# Patient Record
Sex: Female | Born: 1985 | Race: White | Hispanic: No | Marital: Married | Smoking: Never smoker
Health system: Southern US, Community
[De-identification: ages and names within clinical notes are randomized; demographics above are authoritative.]

## PROBLEM LIST (undated history)

## (undated) DIAGNOSIS — Z789 Other specified health status: Secondary | ICD-10-CM

---

## 2005-11-15 HISTORY — PX: WISDOM TOOTH EXTRACTION: SHX21

## 2009-12-15 ENCOUNTER — Ambulatory Visit: Payer: Self-pay | Admitting: Family Medicine

## 2009-12-15 DIAGNOSIS — R42 Dizziness and giddiness: Secondary | ICD-10-CM

## 2009-12-15 DIAGNOSIS — R1902 Left upper quadrant abdominal swelling, mass and lump: Secondary | ICD-10-CM

## 2009-12-17 LAB — CONVERTED CEMR LAB
ALT: 13 units/L (ref 0–53)
Alkaline Phosphatase: 42 units/L (ref 39–117)
BUN: 6 mg/dL (ref 6–23)
Basophils Relative: 0.3 % (ref 0.0–3.0)
Bilirubin, Direct: 0 mg/dL (ref 0.0–0.3)
Chloride: 105 meq/L (ref 96–112)
Creatinine, Ser: 0.9 mg/dL (ref 0.4–1.5)
Eosinophils Relative: 1.3 % (ref 0.0–5.0)
GFR calc non Af Amer: 110.9 mL/min (ref >60)
Glucose, Bld: 78 mg/dL (ref 70–99)
Lymphocytes Relative: 37.1 % (ref 12.0–46.0)
Neutrophils Relative %: 53.4 % (ref 43.0–77.0)
Platelets: 172 10*3/uL (ref 150.0–400.0)
RBC: 4.07 M/uL — ABNORMAL LOW (ref 4.22–5.81)
Total Bilirubin: 0.3 mg/dL (ref 0.3–1.2)
Total Protein: 7.7 g/dL (ref 6.0–8.3)
WBC: 5.8 10*3/uL (ref 4.5–10.5)

## 2009-12-19 ENCOUNTER — Encounter: Admission: RE | Admit: 2009-12-19 | Discharge: 2009-12-19 | Payer: Self-pay | Admitting: Family Medicine

## 2010-01-16 ENCOUNTER — Encounter: Payer: Self-pay | Admitting: Family Medicine

## 2010-10-02 ENCOUNTER — Ambulatory Visit: Payer: Self-pay | Admitting: Family Medicine

## 2010-10-02 DIAGNOSIS — M542 Cervicalgia: Secondary | ICD-10-CM | POA: Insufficient documentation

## 2010-12-06 ENCOUNTER — Encounter: Payer: Self-pay | Admitting: Family Medicine

## 2010-12-15 NOTE — Letter (Signed)
Summary: [facility]   Imported By: Maryln Gottron 02/12/2010 10:16:43  _____________________________________________________________________  External Attachment:    Type:   Image     Comment:   External Document

## 2010-12-15 NOTE — Assessment & Plan Note (Signed)
Summary: NEW PT EST // RS   Vital Signs:  Patient profile:   25 year old female Height:      67.50 inches Weight:      133 pounds BMI:     20.60 Temp:     99 degrees F oral Pulse rate:   72 / minute Pulse rhythm:   regular Resp:     12 per minute BP sitting:   100 / 72  (left arm) Cuff size:   regular  Vitals Entered By: Sid Falcon LPN (December 15, 2009 2:05 PM)  Serial Vital Signs/Assessments:  Time      Position  BP       Pulse  Resp  Temp     By                     27/06                          Evelena Peat MD  CC: New pt to establish   History of Present Illness: New patient to establish care. Has not seen a physician except OB/GYN in approximately 15 years. No immunizations during that time. No chronic medical problems. Takes no medications. No known allergies. Remote history of some bulimia several years ago but none recently.  Family history significant for mother having stroke at 70 grandfather with heart disease and elevated cholesterol.  Patient is married no children. Works as a Environmental manager. Nonsmoker.  Occasional episodes of lightheadedness and even transient syncope which may be related to low blood pressure. No proven hypoglycemia.  For 2 years she's noticed a lump under the left rib cage area left upper quadrant abdomen. Occasional associated pain. No obvious increase in growth  past 2 years. Denies history of injury.  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 Fainting spells  Family History: Family History High cholesterol, grandfather Family History of Stroke F 1st degree relative <60, mother age 29 Family History Hypertension, grandfather Heart disease, grandfather  Social History: Occupation:  Advertising account executive Married Never Smoked Alcohol use-yes Smoking Status:  never Occupation:  employed  Review of Systems  The patient denies anorexia, fever, weight loss, dyspnea on exertion, peripheral edema, prolonged cough, headaches, hemoptysis, abdominal pain, melena, hematochezia, severe indigestion/heartburn, suspicious skin lesions, abnormal bleeding, and enlarged lymph nodes.    Physical Exam  General:  Well-developed,well-nourished,in no acute distress; alert,appropriate and cooperative throughout examination Head:  Normocephalic and atraumatic without obvious abnormalities. No apparent alopecia or balding. Eyes:  No corneal or conjunctival inflammation noted. EOMI. Perrla. Funduscopic exam benign, without hemorrhages, exudates or papilledema. Vision grossly normal. Ears:  External ear exam shows no significant lesions or deformities.  Otoscopic examination reveals clear canals, tympanic membranes are intact bilaterally without bulging, retraction, inflammation or discharge. Hearing is grossly normal bilaterally. Nose:  External nasal examination shows no deformity or inflammation. Nasal mucosa are pink and moist without lesions or exudates. Mouth:  Oral mucosa and oropharynx without lesions or exudates.  Teeth in good repair. Neck:  No deformities, masses, or  tenderness noted. Lungs:  Normal respiratory effort, chest expands symmetrically. Lungs are clear to auscultation, no crackles or wheezes. Heart:  Normal rate and regular rhythm. S1 and S2 normal without gallop, murmur, click, rub or other extra sounds. Abdomen:  soft, non-tender, normal bowel sounds, and no distention.  left upper quadrant she has somewhat  firm somewhat irregular mass which is 2-3 cm in diameter. Depth is somewhat difficult to palpate. Minimally mobile. Nontender no splenomegaly noted. No hepatomegaly. No guarding or rebound Extremities:  No clubbing, cyanosis, edema, or deformity noted with normal full range of motion of all joints.     Impression & Recommendations:  Problem # 1:  ABDOMINAL MASS, LEFT UPPER QUADRANT (ICD-789.32) ?etiology.  This does not feel like a lipoma and needs further evaluation.  Start with labs and abd ultrasound.  Explained may need CT to further evaluate. Orders: Radiology Referral (Radiology) Venipuncture (84696) TLB-CBC Platelet - w/Differential (85025-CBCD) TLB-Hepatic/Liver Function Pnl (80076-HEPATIC) TLB-BMP (Basic Metabolic Panel-BMET) (80048-METABOL)  Problem # 2:  Preventive Health Care (ICD-V70.0) needs Tdap and this is given.  Problem # 3:  POSTURAL LIGHTHEADEDNESS (ICD-780.4) prob related to generally low BP with intermittent dehydration.  Drink more fluids and consider occ supplementation with Gatorade.  Other Orders: Tdap => 36yrs IM (29528) Admin 1st Vaccine (41324)  Patient Instructions: 1)  Will call you regarding lab results. 2)  Drink plenty of fluids and consider supplementing with fluids such as Gatorade if you're having frequent dizziness. 3)  We will call you regarding ultrasound after this is scheduled.  Preventive Care Screening     Pap 10/29/09, normal Last menstural cycle 11/11/09, regular    Immunizations Administered:  Tetanus Vaccine:    Vaccine Type: Tdap    Site: left deltoid    Mfr: GlaxoSmithKline    Dose: 0.5 ml    Route: IM    Given by: Sid Falcon LPN    Exp. Date: 09/10/2010    Lot #: [id number]
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
## 2011-04-22 LAB — RUBELLA ANTIBODY, IGM: Rubella: IMMUNE

## 2011-04-22 LAB — ABO/RH: RH Type: POSITIVE

## 2011-04-22 LAB — HEPATITIS B SURFACE ANTIGEN: Hepatitis B Surface Ag: NEGATIVE

## 2011-04-22 LAB — ANTIBODY SCREEN: Antibody Screen: NEGATIVE

## 2011-10-28 LAB — STREP B DNA PROBE: GBS: POSITIVE

## 2011-10-29 ENCOUNTER — Other Ambulatory Visit (HOSPITAL_COMMUNITY): Payer: Self-pay | Admitting: Obstetrics and Gynecology

## 2011-11-05 ENCOUNTER — Ambulatory Visit (HOSPITAL_COMMUNITY)
Admission: RE | Admit: 2011-11-05 | Discharge: 2011-11-05 | Disposition: A | Discharge status: Home / Self Care | Payer: BC Managed Care – PPO | Source: Ambulatory Visit | Attending: Obstetrics and Gynecology | Admitting: Obstetrics and Gynecology

## 2011-11-05 DIAGNOSIS — O365990 Maternal care for other known or suspected poor fetal growth, unspecified trimester, not applicable or unspecified: Secondary | ICD-10-CM | POA: Insufficient documentation

## 2011-11-05 DIAGNOSIS — Z3689 Encounter for other specified antenatal screening: Secondary | ICD-10-CM | POA: Insufficient documentation

## 2011-11-10 ENCOUNTER — Other Ambulatory Visit (HOSPITAL_COMMUNITY): Payer: Self-pay | Admitting: Obstetrics and Gynecology

## 2011-11-10 DIAGNOSIS — O4100X0 Oligohydramnios, unspecified trimester, not applicable or unspecified: Secondary | ICD-10-CM

## 2011-11-12 ENCOUNTER — Ambulatory Visit (HOSPITAL_COMMUNITY)
Admission: RE | Admit: 2011-11-12 | Discharge: 2011-11-12 | Disposition: A | Discharge status: Home / Self Care | Payer: BC Managed Care – PPO | Source: Ambulatory Visit | Attending: Obstetrics and Gynecology | Admitting: Obstetrics and Gynecology

## 2011-11-12 DIAGNOSIS — Z3689 Encounter for other specified antenatal screening: Secondary | ICD-10-CM | POA: Insufficient documentation

## 2011-11-12 DIAGNOSIS — O4100X0 Oligohydramnios, unspecified trimester, not applicable or unspecified: Secondary | ICD-10-CM

## 2011-11-16 NOTE — L&D Delivery Note (Signed)
Delivery Note Pt pushed great about 30 minutes and at 2:40 AM a healthy female was delivered via Vaginal, Spontaneous Delivery (Presentation: Right Occiput Anterior).  APGAR: 8, 9; weight 8 lb 2.2 oz (3690 g).   Placenta status: Intact, Spontaneous.  Cord: 3 vessels with the following complications: Loose nuchal x 1.  Anesthesia: Local  Episiotomy: None Lacerations: 2nd degree;Perineal Suture Repair: 3.0 vicryl rapide Est. Blood Loss (mL): 400  Mom to postpartum.  Baby to nursery-stable.  Oliver Pila 11/25/2011, 3:15 AM

## 2011-11-22 ENCOUNTER — Encounter (HOSPITAL_COMMUNITY): Payer: Self-pay | Admitting: *Deleted

## 2011-11-22 ENCOUNTER — Telehealth (HOSPITAL_COMMUNITY): Payer: Self-pay | Admitting: *Deleted

## 2011-11-22 NOTE — Telephone Encounter (Signed)
Preadmission screen  

## 2011-11-25 ENCOUNTER — Encounter (HOSPITAL_COMMUNITY): Payer: Self-pay

## 2011-11-25 ENCOUNTER — Inpatient Hospital Stay (HOSPITAL_COMMUNITY)
Admission: AD | Admit: 2011-11-25 | Discharge: 2011-11-27 | DRG: 373 | Disposition: A | Discharge status: Home / Self Care | Payer: BC Managed Care – PPO | Source: Ambulatory Visit | Attending: Obstetrics and Gynecology | Admitting: Obstetrics and Gynecology

## 2011-11-25 DIAGNOSIS — O99892 Other specified diseases and conditions complicating childbirth: Secondary | ICD-10-CM | POA: Diagnosis present

## 2011-11-25 DIAGNOSIS — Z22330 Carrier of Group B streptococcus: Secondary | ICD-10-CM

## 2011-11-25 LAB — CBC
HCT: 32.5 % — ABNORMAL LOW (ref 36.0–46.0)
MCH: 31.8 pg (ref 26.0–34.0)
MCHC: 35.4 g/dL (ref 30.0–36.0)
MCV: 89.8 fL (ref 78.0–100.0)
RDW: 12.5 % (ref 11.5–15.5)

## 2011-11-25 MED ORDER — TETANUS-DIPHTH-ACELL PERTUSSIS 5-2.5-18.5 LF-MCG/0.5 IM SUSP: 0.5000 mL | Freq: Once | INTRAMUSCULAR | Status: DC

## 2011-11-25 MED ORDER — EPHEDRINE 5 MG/ML INJ: 10.0000 mg | INTRAVENOUS | Status: DC | PRN

## 2011-11-25 MED ORDER — SENNOSIDES-DOCUSATE SODIUM 8.6-50 MG PO TABS
2.0000 | ORAL_TABLET | Freq: Every day | ORAL | Status: DC
  Administered 2011-11-25 – 2011-11-26 (×2): 2 via ORAL

## 2011-11-25 MED ORDER — SIMETHICONE 80 MG PO CHEW: 80.0000 mg | CHEWABLE_TABLET | ORAL | Status: DC | PRN

## 2011-11-25 MED ORDER — IBUPROFEN 600 MG PO TABS
600.0000 mg | ORAL_TABLET | Freq: Four times a day (QID) | ORAL | Status: DC
  Administered 2011-11-25 – 2011-11-27 (×9): 600 mg via ORAL
  Filled 2011-11-25 (×9): qty 1

## 2011-11-25 MED ORDER — FENTANYL 2.5 MCG/ML BUPIVACAINE 1/10 % EPIDURAL INFUSION (WH - ANES): 14.0000 mL/h | INTRAMUSCULAR | Status: DC

## 2011-11-25 MED ORDER — ONDANSETRON HCL 4 MG/2ML IJ SOLN: 4.0000 mg | INTRAMUSCULAR | Status: DC | PRN

## 2011-11-25 MED ORDER — LANOLIN HYDROUS EX OINT: TOPICAL_OINTMENT | CUTANEOUS | Status: DC | PRN

## 2011-11-25 MED ORDER — BUTORPHANOL TARTRATE 2 MG/ML IJ SOLN: 1.0000 mg | INTRAMUSCULAR | Status: DC | PRN

## 2011-11-25 MED ORDER — DIBUCAINE 1 % RE OINT: 1.0000 "application " | TOPICAL_OINTMENT | RECTAL | Status: DC | PRN

## 2011-11-25 MED ORDER — OXYTOCIN 20 UNITS IN LACTATED RINGERS INFUSION - SIMPLE: 125.0000 mL/h | Freq: Once | INTRAVENOUS | Status: DC

## 2011-11-25 MED ORDER — OXYCODONE-ACETAMINOPHEN 5-325 MG PO TABS: 1.0000 | ORAL_TABLET | ORAL | Status: DC | PRN

## 2011-11-25 MED ORDER — ACETAMINOPHEN 325 MG PO TABS: 650.0000 mg | ORAL_TABLET | ORAL | Status: DC | PRN

## 2011-11-25 MED ORDER — ONDANSETRON HCL 4 MG/2ML IJ SOLN: 4.0000 mg | Freq: Four times a day (QID) | INTRAMUSCULAR | Status: DC | PRN

## 2011-11-25 MED ORDER — BENZOCAINE-MENTHOL 20-0.5 % EX AERO
INHALATION_SPRAY | CUTANEOUS | Status: AC
  Filled 2011-11-25: qty 56

## 2011-11-25 MED ORDER — OXYTOCIN BOLUS FROM INFUSION
500.0000 mL | Freq: Once | INTRAVENOUS | Status: AC
  Administered 2011-11-25: 500 mL via INTRAVENOUS
  Filled 2011-11-25: qty 500
  Filled 2011-11-25: qty 1000

## 2011-11-25 MED ORDER — BENZOCAINE-MENTHOL 20-0.5 % EX AERO: 1.0000 "application " | INHALATION_SPRAY | CUTANEOUS | Status: DC | PRN

## 2011-11-25 MED ORDER — DIPHENHYDRAMINE HCL 25 MG PO CAPS: 25.0000 mg | ORAL_CAPSULE | Freq: Four times a day (QID) | ORAL | Status: DC | PRN

## 2011-11-25 MED ORDER — PHENYLEPHRINE 40 MCG/ML (10ML) SYRINGE FOR IV PUSH (FOR BLOOD PRESSURE SUPPORT): 80.0000 ug | PREFILLED_SYRINGE | INTRAVENOUS | Status: DC | PRN

## 2011-11-25 MED ORDER — LIDOCAINE HCL (PF) 1 % IJ SOLN
30.0000 mL | INTRAMUSCULAR | Status: DC | PRN
  Administered 2011-11-25: 30 mL via SUBCUTANEOUS
  Filled 2011-11-25: qty 30

## 2011-11-25 MED ORDER — LACTATED RINGERS IV SOLN: 500.0000 mL | INTRAVENOUS | Status: DC | PRN

## 2011-11-25 MED ORDER — LACTATED RINGERS IV SOLN
INTRAVENOUS | Status: DC
  Administered 2011-11-25: 02:00:00 via INTRAVENOUS

## 2011-11-25 MED ORDER — ZOLPIDEM TARTRATE 5 MG PO TABS: 5.0000 mg | ORAL_TABLET | Freq: Every evening | ORAL | Status: DC | PRN

## 2011-11-25 MED ORDER — AMPICILLIN SODIUM 2 G IJ SOLR
2.0000 g | Freq: Once | INTRAMUSCULAR | Status: AC
  Administered 2011-11-25: 2 g via INTRAVENOUS
  Filled 2011-11-25: qty 2000

## 2011-11-25 MED ORDER — IBUPROFEN 600 MG PO TABS
600.0000 mg | ORAL_TABLET | Freq: Four times a day (QID) | ORAL | Status: DC | PRN
  Administered 2011-11-25: 600 mg via ORAL
  Filled 2011-11-25: qty 1

## 2011-11-25 MED ORDER — CITRIC ACID-SODIUM CITRATE 334-500 MG/5ML PO SOLN: 30.0000 mL | ORAL | Status: DC | PRN

## 2011-11-25 MED ORDER — DIPHENHYDRAMINE HCL 50 MG/ML IJ SOLN: 12.5000 mg | INTRAMUSCULAR | Status: DC | PRN

## 2011-11-25 MED ORDER — WITCH HAZEL-GLYCERIN EX PADS: 1.0000 "application " | MEDICATED_PAD | CUTANEOUS | Status: DC | PRN

## 2011-11-25 MED ORDER — ONDANSETRON HCL 4 MG PO TABS: 4.0000 mg | ORAL_TABLET | ORAL | Status: DC | PRN

## 2011-11-25 MED ORDER — PRENATAL MULTIVITAMIN CH
1.0000 | ORAL_TABLET | Freq: Every day | ORAL | Status: DC
  Administered 2011-11-25 – 2011-11-27 (×3): 1 via ORAL
  Filled 2011-11-25 (×3): qty 1

## 2011-11-25 MED ORDER — FLEET ENEMA 7-19 GM/118ML RE ENEM: 1.0000 | ENEMA | RECTAL | Status: DC | PRN

## 2011-11-25 MED ORDER — LACTATED RINGERS IV SOLN: 500.0000 mL | Freq: Once | INTRAVENOUS | Status: DC

## 2011-11-25 MED ORDER — OXYCODONE-ACETAMINOPHEN 5-325 MG PO TABS: 2.0000 | ORAL_TABLET | ORAL | Status: DC | PRN

## 2011-11-25 NOTE — H&P (Signed)
Felicia Simmons is a 26 y.o. female G1P0  At 107 1/7 weeks (EDD 11/24/11 by 8 week [place]) presenting for active labor.  Prenatal care uneventful except positive GBS status.  Maternal Medical History:  Reason for admission: Reason for admission: contractions.  Contractions: Onset was 6-12 hours ago.   Frequency: regular.   Perceived severity is strong.    Fetal activity: Perceived fetal activity is normal.      OB History    Grav Para Term Preterm Abortions TAB SAB Ect Mult Living   1 1 1       1      History reviewed. No pertinent past medical history. History reviewed. No pertinent past surgical history. Family History: family history includes Heart disease in her maternal grandfather and paternal grandfather; Hypertension in her maternal grandfather and paternal grandfather; and Stroke in her mother. Social History:  reports that she has never smoked. She has never used smokeless tobacco. She reports that she does not drink alcohol or use illicit drugs.  ROS  Dilation: 10 Effacement (%): 100 Station: 0 Exam by:: Senaida Ores, MD Blood pressure 96/78, pulse 91, temperature 98.3 F (36.8 C), temperature source Oral, resp. rate 18, height 5\' 10"  (1.778 m), weight 68.04 kg (150 lb), last menstrual period 02/12/2011, unknown if currently breastfeeding. Maternal Exam:  Uterine Assessment: Contraction strength is firm.  Contraction frequency is regular.   Abdomen: Patient reports no abdominal tenderness. Fetal presentation: vertex  Introitus: Normal vulva. Normal vagina.    Fetal Exam Fetal State Assessment: Category I - tracings are normal.     Physical Exam  Constitutional: She is oriented to person, place, and time. She appears well-developed and well-nourished.  Cardiovascular: Normal rate and regular rhythm.   Respiratory: Effort normal and breath sounds normal.  GI: Soft. Bowel sounds are normal.  Genitourinary: Vagina normal and uterus normal.       Cervix on admission c/6/0  rapidly progressed to complete.  Musculoskeletal: Normal range of motion.  Neurological: She is alert and oriented to person, place, and time.  Psychiatric: She has a normal mood and affect. Her behavior is normal.    Prenatal labs: ABO, Rh: O/Positive/-- (06/07 0000) Antibody: Negative (06/07 0000) Rubella: Immune (06/07 0000) RPR: Nonreactive (06/07 0000)  HBsAg: Negative (06/07 0000)  HIV: Non-reactive (06/07 0000)  GBS: Positive (12/13 0000)  One hour glucola WNL Declined genetic screenings  Assessment/Plan: Pt was able to get one dose of ampicillin, but progressed rapidly to complete dilation and began pushing with no pain meds.    Oliver Pila 11/25/2011, 3:09 AM

## 2011-11-25 NOTE — ED Notes (Signed)
Pt going to room 167 for triage.

## 2011-11-25 NOTE — Progress Notes (Signed)
Post Partum Day 0 Subjective: no complaints and voiding  Objective: Blood pressure 98/60, pulse 76, temperature 98.8 F (37.1 C), temperature source Oral, resp. rate 18, height 5\' 10"  (1.778 m), weight 68.04 kg (150 lb), last menstrual period 02/12/2011, unknown if currently breastfeeding.  Physical Exam:  General: alert Lochia: appropriate Uterine Fundus: firm     Basename 11/25/11 0133  HGB 11.5*  HCT 32.5*    Assessment/Plan: Continue care.  D/w pt circumcision in detail, desires to proceed.   LOS: 0 days   Fredonia Casalino W 11/25/2011, 12:16 PM

## 2011-11-26 NOTE — Progress Notes (Signed)
Post Partum Day 1 Subjective: no complaints and tolerating PO  Objective: Blood pressure 93/58, pulse 62, temperature 98.2 F (36.8 C), temperature source Oral, resp. rate 18, height 5\' 10"  (1.778 m), weight 68.04 kg (150 lb), last menstrual period 02/12/2011, unknown if currently breastfeeding.  Physical Exam:  General: alert Lochia: appropriate Uterine Fundus: firm  Basename 11/25/11 0133  HGB 11.5*  HCT 32.5*    Assessment/Plan: Plan for discharge tomorrow   LOS: 1 day   Kenniya Westrich W 11/26/2011, 7:55 AM

## 2011-11-27 MED ORDER — PRENATAL MULTIVITAMIN CH: 1.0000 | ORAL_TABLET | Freq: Every day | ORAL | Status: DC

## 2011-11-27 MED ORDER — IBUPROFEN 800 MG PO TABS: 800.0000 mg | ORAL_TABLET | Freq: Four times a day (QID) | ORAL | Status: AC

## 2011-11-27 MED ORDER — OXYCODONE-ACETAMINOPHEN 5-325 MG PO TABS: 1.0000 | ORAL_TABLET | Freq: Four times a day (QID) | ORAL | Status: AC | PRN

## 2011-11-27 NOTE — Discharge Summary (Signed)
Obstetric Discharge Summary Reason for Admission: onset of labor Prenatal Procedures: none Intrapartum Procedures: spontaneous vaginal delivery Postpartum Procedures: none Complications-Operative and Postpartum: 2nd degree perineal laceration Hemoglobin  Date Value Range Status  11/25/2011 11.5* 12.0-15.0 (g/dL) Final     HCT  Date Value Range Status  11/25/2011 32.5* 36.0-46.0 (%) Final    Discharge Diagnoses: Term Pregnancy-delivered  Discharge Information: Date: 11/27/2011 Activity: pelvic rest Diet: routine Medications: PNV, Ibuprofen and Percocet Condition: stable Instructions: refer to practice specific booklet Discharge to: home Follow-up Information    Follow up with BOVARD,Glenora Morocho, MD. Schedule an appointment as soon as possible for a visit in 6 weeks.   Contact information:   [address] [facility] [address] [place] [address] [phone]          Newborn Data: Live born female  Birth Weight: 8 lb 2.2 oz (3690 g) APGAR: 8, 9  Home with mother.  BOVARD,Nyazia Canevari 11/27/2011, 10:56 AM

## 2011-11-27 NOTE — Progress Notes (Signed)
Post Partum Day 2 Subjective: no complaints, up ad lib, voiding, tolerating PO and nl lochia, pain controlled  Objective: Blood pressure 114/75, pulse 66, temperature 98 F (36.7 C), temperature source Oral, resp. rate 18, height 5\' 10"  (1.778 m), weight 68.04 kg (150 lb), last menstrual period 02/12/2011, unknown if currently breastfeeding.  Physical Exam:  General: alert and no distress Lochia: appropriate Uterine Fundus: firm    Basename 11/25/11 0133  HGB 11.5*  HCT 32.5*    Assessment/Plan: Discharge home and Breastfeeding D/c with Motrin/percocet/ pnv, f/u 6 weeks  LOS: 2 days   BOVARD,Dalayna Lauter 11/27/2011, 10:49 AM

## 2011-11-30 ENCOUNTER — Inpatient Hospital Stay (HOSPITAL_COMMUNITY): Admission: RE | Admit: 2011-11-30 | Payer: BC Managed Care – PPO | Source: Ambulatory Visit

## 2011-11-30 ENCOUNTER — Encounter (HOSPITAL_COMMUNITY): Payer: BC Managed Care – PPO

## 2012-11-15 NOTE — L&D Delivery Note (Signed)
Delivery Note At 4:32 AM a healthy female was delivered via Vaginal, Spontaneous Delivery (Presentation: ; Occiput Anterior).  APGAR: 8, 8; weight pending.   Placenta status: Intact, Spontaneous.  Cord: 3 vessels with the following complications: None.    Anesthesia: Local  Episiotomy: None Lacerations: 2nd degree;Vaginal;Periurethral Suture Repair: 3.0 vicryl rapide Est. Blood Loss (mL): 300cc  Mom to postpartum.  Baby to stay with mother.  Oliver Pila 08/18/2013, 5:03 AM

## 2013-01-30 LAB — OB RESULTS CONSOLE ABO/RH: RH Type: POSITIVE

## 2013-01-30 LAB — OB RESULTS CONSOLE HIV ANTIBODY (ROUTINE TESTING): HIV: NONREACTIVE

## 2013-01-30 LAB — OB RESULTS CONSOLE GC/CHLAMYDIA: Gonorrhea: NEGATIVE

## 2013-01-30 LAB — OB RESULTS CONSOLE RUBELLA ANTIBODY, IGM: Rubella: IMMUNE

## 2013-01-30 LAB — OB RESULTS CONSOLE RPR: RPR: NONREACTIVE

## 2013-08-18 ENCOUNTER — Encounter (HOSPITAL_COMMUNITY): Payer: Self-pay | Admitting: *Deleted

## 2013-08-18 ENCOUNTER — Inpatient Hospital Stay (HOSPITAL_COMMUNITY)
Admission: AD | Admit: 2013-08-18 | Discharge: 2013-08-19 | DRG: 373 | Disposition: A | Discharge status: Home / Self Care | Payer: BC Managed Care – PPO | Source: Ambulatory Visit | Attending: Obstetrics and Gynecology | Admitting: Obstetrics and Gynecology

## 2013-08-18 DIAGNOSIS — Z22330 Carrier of Group B streptococcus: Secondary | ICD-10-CM

## 2013-08-18 DIAGNOSIS — O99892 Other specified diseases and conditions complicating childbirth: Secondary | ICD-10-CM | POA: Diagnosis present

## 2013-08-18 LAB — CBC
HCT: 32.7 % — ABNORMAL LOW (ref 36.0–46.0)
Hemoglobin: 11.5 g/dL — ABNORMAL LOW (ref 12.0–15.0)
MCH: 31.2 pg (ref 26.0–34.0)
MCHC: 35.2 g/dL (ref 30.0–36.0)
MCV: 88.6 fL (ref 78.0–100.0)
Platelets: 96 10*3/uL — ABNORMAL LOW (ref 150–400)
RBC: 3.69 MIL/uL — ABNORMAL LOW (ref 3.87–5.11)
WBC: 8.3 10*3/uL (ref 4.0–10.5)

## 2013-08-18 MED ORDER — WITCH HAZEL-GLYCERIN EX PADS: 1.0000 "application " | MEDICATED_PAD | CUTANEOUS | Status: DC | PRN

## 2013-08-18 MED ORDER — ZOLPIDEM TARTRATE 5 MG PO TABS: 5.0000 mg | ORAL_TABLET | Freq: Every evening | ORAL | Status: DC | PRN

## 2013-08-18 MED ORDER — DIBUCAINE 1 % RE OINT: 1.0000 "application " | TOPICAL_OINTMENT | RECTAL | Status: DC | PRN

## 2013-08-18 MED ORDER — ONDANSETRON HCL 4 MG PO TABS: 4.0000 mg | ORAL_TABLET | ORAL | Status: DC | PRN

## 2013-08-18 MED ORDER — ONDANSETRON HCL 4 MG/2ML IJ SOLN: 4.0000 mg | INTRAMUSCULAR | Status: DC | PRN

## 2013-08-18 MED ORDER — SODIUM CHLORIDE 0.9 % IV SOLN
2.0000 g | Freq: Once | INTRAVENOUS | Status: AC
  Administered 2013-08-18: 2 g via INTRAVENOUS
  Filled 2013-08-18: qty 2000

## 2013-08-18 MED ORDER — FLEET ENEMA 7-19 GM/118ML RE ENEM: 1.0000 | ENEMA | RECTAL | Status: DC | PRN

## 2013-08-18 MED ORDER — LIDOCAINE HCL (PF) 1 % IJ SOLN
30.0000 mL | INTRAMUSCULAR | Status: DC | PRN
  Administered 2013-08-18: 30 mL via SUBCUTANEOUS
  Filled 2013-08-18 (×2): qty 30

## 2013-08-18 MED ORDER — PRENATAL MULTIVITAMIN CH
1.0000 | ORAL_TABLET | Freq: Every day | ORAL | Status: DC
  Administered 2013-08-18 – 2013-08-19 (×2): 1 via ORAL
  Filled 2013-08-18 (×2): qty 1

## 2013-08-18 MED ORDER — OXYCODONE-ACETAMINOPHEN 5-325 MG PO TABS
1.0000 | ORAL_TABLET | ORAL | Status: DC | PRN
  Administered 2013-08-18: 1 via ORAL
  Filled 2013-08-18: qty 1

## 2013-08-18 MED ORDER — OXYCODONE-ACETAMINOPHEN 5-325 MG PO TABS: 1.0000 | ORAL_TABLET | ORAL | Status: DC | PRN

## 2013-08-18 MED ORDER — LANOLIN HYDROUS EX OINT: TOPICAL_OINTMENT | CUTANEOUS | Status: DC | PRN

## 2013-08-18 MED ORDER — LACTATED RINGERS IV SOLN
INTRAVENOUS | Status: DC
  Administered 2013-08-18: 04:00:00 via INTRAVENOUS

## 2013-08-18 MED ORDER — OXYTOCIN BOLUS FROM INFUSION: 500.0000 mL | INTRAVENOUS | Status: DC

## 2013-08-18 MED ORDER — OXYTOCIN 40 UNITS IN LACTATED RINGERS INFUSION - SIMPLE MED
62.5000 mL/h | INTRAVENOUS | Status: DC
  Administered 2013-08-18: 500 mL/h via INTRAVENOUS
  Administered 2013-08-18: 62.5 mL/h via INTRAVENOUS
  Filled 2013-08-18: qty 1000

## 2013-08-18 MED ORDER — TETANUS-DIPHTH-ACELL PERTUSSIS 5-2.5-18.5 LF-MCG/0.5 IM SUSP: 0.5000 mL | Freq: Once | INTRAMUSCULAR | Status: DC

## 2013-08-18 MED ORDER — ONDANSETRON HCL 4 MG/2ML IJ SOLN: 4.0000 mg | Freq: Four times a day (QID) | INTRAMUSCULAR | Status: DC | PRN

## 2013-08-18 MED ORDER — SENNOSIDES-DOCUSATE SODIUM 8.6-50 MG PO TABS
2.0000 | ORAL_TABLET | ORAL | Status: DC
  Administered 2013-08-19: 2 via ORAL

## 2013-08-18 MED ORDER — IBUPROFEN 600 MG PO TABS
600.0000 mg | ORAL_TABLET | Freq: Four times a day (QID) | ORAL | Status: DC
  Administered 2013-08-18 – 2013-08-19 (×5): 600 mg via ORAL
  Filled 2013-08-18 (×5): qty 1

## 2013-08-18 MED ORDER — BENZOCAINE-MENTHOL 20-0.5 % EX AERO
1.0000 "application " | INHALATION_SPRAY | CUTANEOUS | Status: DC | PRN
  Administered 2013-08-18: 1 via TOPICAL
  Filled 2013-08-18: qty 56

## 2013-08-18 MED ORDER — LACTATED RINGERS IV SOLN: 500.0000 mL | INTRAVENOUS | Status: DC | PRN

## 2013-08-18 MED ORDER — DIPHENHYDRAMINE HCL 25 MG PO CAPS: 25.0000 mg | ORAL_CAPSULE | Freq: Four times a day (QID) | ORAL | Status: DC | PRN

## 2013-08-18 MED ORDER — SIMETHICONE 80 MG PO CHEW: 80.0000 mg | CHEWABLE_TABLET | ORAL | Status: DC | PRN

## 2013-08-18 MED ORDER — CITRIC ACID-SODIUM CITRATE 334-500 MG/5ML PO SOLN: 30.0000 mL | ORAL | Status: DC | PRN

## 2013-08-18 MED ORDER — ACETAMINOPHEN 325 MG PO TABS: 650.0000 mg | ORAL_TABLET | ORAL | Status: DC | PRN

## 2013-08-18 MED ORDER — IBUPROFEN 600 MG PO TABS
600.0000 mg | ORAL_TABLET | Freq: Four times a day (QID) | ORAL | Status: DC | PRN
  Administered 2013-08-18: 600 mg via ORAL
  Filled 2013-08-18: qty 1

## 2013-08-18 NOTE — H&P (Signed)
Felicia Simmons is a 27 y.o. female G2P1001 at 39+weeks (EDD 08/24/13 by a 6 week [place]) presenting for active labor and cervical dilation to 8 cm.  Prenatal care complicated by +GBS.  Otherwise uneventful.   Maternal Medical History:  Reason for admission: Contractions.   Contractions: Onset was 1-2 hours ago.   Frequency: regular.   Perceived severity is strong.    Fetal activity: Perceived fetal activity is normal.    Prenatal Complications - Diabetes: none.   Past ObHx NSVD 1/13 8#2oz  No past medical history on file. No past surgical history on file. Family History: family history includes Heart disease in her maternal grandfather and paternal grandfather; Hypertension in her maternal grandfather and paternal grandfather; Stroke in her mother. Social History:  reports that she has never smoked. She has never used smokeless tobacco. She reports that she does not drink alcohol or use illicit drugs.   Prenatal Transfer Tool  Maternal Diabetes: No Genetic Screening: Declined Maternal Ultrasounds/Referrals: Normal Fetal Ultrasounds or other Referrals:  None Maternal Substance Abuse:  No Significant Maternal Medications:  None Significant Maternal Lab Results:  Lab values include: Group B Strep positive Other Comments:  None  ROS    unknown if currently breastfeeding. Maternal Exam:  Uterine Assessment: Contraction strength is firm.  Contraction frequency is regular.   Abdomen: Patient reports no abdominal tenderness. Fetal presentation: vertex  Introitus: Normal vulva. Normal vagina.    Physical Exam  Constitutional: She is oriented to person, place, and time. She appears well-developed and well-nourished.  Cardiovascular: Normal rate and regular rhythm.   Respiratory: Effort normal and breath sounds normal.  GI: Soft.  Genitourinary: Vagina normal and uterus normal.  Neurological: She is alert and oriented to person, place, and time.  Psychiatric: She has a normal mood  and affect.    Prenatal labs: ABO, Rh:  O positive Antibody:  Negative Rubella:  Immune RPR:   NR HBsAg:  Neg  HIV:   NR GBS:   Positive One hour GTT 105 Declined genetic screenings  Assessment/Plan: Pt arrives in active labor, will try to get Ampicillin on board.  Expect vaginal delivery.   Oliver Pila 08/18/2013, 3:29 AM

## 2013-08-18 NOTE — MAU Note (Signed)
Patient presents with complaints of strong contractions. Was 4cm on Wednesday and has a history of fast labor. SVE 8/80/-1 intact. Dr. Senaida Ores notified of SVE and GBS positive. Orders received and patient transferred to Rm 168.

## 2013-08-19 LAB — CBC
MCHC: 35.4 g/dL (ref 30.0–36.0)
MCV: 91.7 fL (ref 78.0–100.0)
Platelets: 102 10*3/uL — ABNORMAL LOW (ref 150–400)
RDW: 12.4 % (ref 11.5–15.5)
WBC: 9.7 10*3/uL (ref 4.0–10.5)

## 2013-08-19 MED ORDER — IBUPROFEN 600 MG PO TABS: 600.0000 mg | ORAL_TABLET | Freq: Four times a day (QID) | ORAL | Status: DC

## 2013-08-19 MED ORDER — OXYCODONE-ACETAMINOPHEN 5-325 MG PO TABS: 1.0000 | ORAL_TABLET | ORAL | Status: DC | PRN

## 2013-08-19 NOTE — Discharge Summary (Signed)
Obstetric Discharge Summary Reason for Admission: onset of labor Prenatal Procedures: none Intrapartum Procedures: spontaneous vaginal delivery Postpartum Procedures: none Complications-Operative and Postpartum: second degree perineal laceration Hemoglobin  Date Value Range Status  08/19/2013 10.5* 12.0 - 15.0 g/dL Final     HCT  Date Value Range Status  08/19/2013 29.7* 36.0 - 46.0 % Final    Physical Exam:  General: alert and cooperative Lochia: appropriate Uterine Fundus: firm   Discharge Diagnoses: Term Pregnancy-delivered  Discharge Information: Date: 08/19/2013 Activity: pelvic rest Diet: routine Medications: Ibuprofen and Percocet Condition: improved Instructions: refer to practice specific booklet Discharge to: home Follow-up Information   Follow up with Felicia Pila, MD. Schedule an appointment as soon as possible for a visit in 6 weeks. (postpartum)    Specialty:  Obstetrics and Gynecology   Contact information:   [address], [address] [place] [address] [phone]       Newborn Data: Live born female  Birth Weight: 9 lb 9.1 oz (4340 g) APGAR: 8, 8  Home with mother.  Felicia Simmons 08/19/2013, 10:16 AM

## 2013-08-19 NOTE — Progress Notes (Signed)
Subjective: Postpartum Day 1 Patient reports tolerating PO and no problems voiding.    Objective: Vital signs in last 24 hours: Temp:  [98 F (36.7 C)-98.9 F (37.2 C)] 98.1 F (36.7 C) (10/05 0519) Pulse Rate:  [53-69] 53 (10/05 0519) Resp:  [16-20] 18 (10/05 0519) BP: (99-110)/(59-71) 99/59 mmHg (10/05 0519)  Physical Exam:  General: alert and cooperative Lochia: appropriate Uterine Fundus: firm    Recent Labs  08/18/13 0355 08/19/13 0625  HGB 11.5* 10.5*  HCT 32.7* 29.7*    Assessment/Plan:  Discharge home this pm if baby able to go.  Michala Deblanc W 08/19/2013, 10:14 AM

## 2014-09-16 ENCOUNTER — Encounter (HOSPITAL_COMMUNITY): Payer: Self-pay | Admitting: *Deleted

## 2014-10-30 LAB — OB RESULTS CONSOLE ABO/RH: RH Type: POSITIVE

## 2014-10-30 LAB — OB RESULTS CONSOLE RUBELLA ANTIBODY, IGM: Rubella: IMMUNE

## 2014-10-30 LAB — OB RESULTS CONSOLE ANTIBODY SCREEN: ANTIBODY SCREEN: NEGATIVE

## 2014-10-30 LAB — OB RESULTS CONSOLE GC/CHLAMYDIA
Chlamydia: NEGATIVE
GC PROBE AMP, GENITAL: NEGATIVE

## 2014-10-30 LAB — OB RESULTS CONSOLE RPR: RPR: NONREACTIVE

## 2014-10-30 LAB — OB RESULTS CONSOLE HIV ANTIBODY (ROUTINE TESTING): HIV: NONREACTIVE

## 2014-10-30 LAB — OB RESULTS CONSOLE HEPATITIS B SURFACE ANTIGEN: HEP B S AG: NEGATIVE

## 2014-11-15 NOTE — L&D Delivery Note (Signed)
Delivery Note At 4:53 AM a viable female was delivered via Vaginal, Spontaneous Delivery (Presentation: Right Occiput Anterior).  APGAR: 8, 9; weight pending.   Placenta status: Intact, Manual removal.  Cord: 3 vessels with the following complications: None.  Anesthesia: None  Episiotomy: None Lacerations: None Suture Repair: none Est. Blood Loss (mL): 200  Mom to postpartum.  Baby to Couplet care / Skin to Skin.  Will do circumcision tomorrow morning.  [name] D 05/21/2015, 5:08 AM

## 2015-05-07 LAB — OB RESULTS CONSOLE GBS: GBS: POSITIVE

## 2015-05-21 ENCOUNTER — Inpatient Hospital Stay (HOSPITAL_COMMUNITY)
Admission: AD | Admit: 2015-05-21 | Discharge: 2015-05-22 | DRG: 767 | Disposition: A | Discharge status: Home / Self Care | Payer: BLUE CROSS/BLUE SHIELD | Source: Ambulatory Visit | Attending: Obstetrics and Gynecology | Admitting: Obstetrics and Gynecology

## 2015-05-21 ENCOUNTER — Encounter (HOSPITAL_COMMUNITY): Payer: Self-pay | Admitting: *Deleted

## 2015-05-21 DIAGNOSIS — O99824 Streptococcus B carrier state complicating childbirth: Secondary | ICD-10-CM | POA: Diagnosis present

## 2015-05-21 DIAGNOSIS — Z823 Family history of stroke: Secondary | ICD-10-CM

## 2015-05-21 DIAGNOSIS — Z3A37 37 weeks gestation of pregnancy: Secondary | ICD-10-CM | POA: Diagnosis present

## 2015-05-21 DIAGNOSIS — Z8249 Family history of ischemic heart disease and other diseases of the circulatory system: Secondary | ICD-10-CM | POA: Diagnosis not present

## 2015-05-21 HISTORY — DX: Other specified health status: Z78.9

## 2015-05-21 LAB — CBC
HCT: 31.3 % — ABNORMAL LOW (ref 36.0–46.0)
HEMOGLOBIN: 10.9 g/dL — AB (ref 12.0–15.0)
MCH: 31.7 pg (ref 26.0–34.0)
MCHC: 34.8 g/dL (ref 30.0–36.0)
MCV: 91 fL (ref 78.0–100.0)
Platelets: 118 10*3/uL — ABNORMAL LOW (ref 150–400)
RBC: 3.44 MIL/uL — AB (ref 3.87–5.11)
RDW: 12.8 % (ref 11.5–15.5)
WBC: 8.8 10*3/uL (ref 4.0–10.5)

## 2015-05-21 LAB — TYPE AND SCREEN
ABO/RH(D): O POS
ANTIBODY SCREEN: NEGATIVE

## 2015-05-21 LAB — RPR: RPR: NONREACTIVE

## 2015-05-21 MED ORDER — PENICILLIN G POTASSIUM 5000000 UNITS IJ SOLR
5.0000 10*6.[IU] | Freq: Once | INTRAMUSCULAR | Status: AC
  Administered 2015-05-21: 5 10*6.[IU] via INTRAVENOUS
  Filled 2015-05-21: qty 5

## 2015-05-21 MED ORDER — OXYCODONE-ACETAMINOPHEN 5-325 MG PO TABS
1.0000 | ORAL_TABLET | ORAL | Status: DC | PRN
  Administered 2015-05-21 (×2): 1 via ORAL
  Filled 2015-05-21 (×2): qty 1

## 2015-05-21 MED ORDER — LANOLIN HYDROUS EX OINT: TOPICAL_OINTMENT | CUTANEOUS | Status: DC | PRN

## 2015-05-21 MED ORDER — WITCH HAZEL-GLYCERIN EX PADS: 1.0000 "application " | MEDICATED_PAD | CUTANEOUS | Status: DC | PRN

## 2015-05-21 MED ORDER — METHYLERGONOVINE MALEATE 0.2 MG PO TABS: 0.2000 mg | ORAL_TABLET | ORAL | Status: DC | PRN

## 2015-05-21 MED ORDER — OXYTOCIN BOLUS FROM INFUSION
500.0000 mL | INTRAVENOUS | Status: DC
Start: 2015-05-21 — End: 2015-05-21
  Administered 2015-05-21: 500 mL via INTRAVENOUS

## 2015-05-21 MED ORDER — LACTATED RINGERS IV SOLN
INTRAVENOUS | Status: DC
  Administered 2015-05-21: 02:00:00 via INTRAVENOUS

## 2015-05-21 MED ORDER — MEASLES, MUMPS & RUBELLA VAC ~~LOC~~ INJ
0.5000 mL | INJECTION | Freq: Once | SUBCUTANEOUS | Status: DC
  Filled 2015-05-21: qty 0.5

## 2015-05-21 MED ORDER — SIMETHICONE 80 MG PO CHEW: 80.0000 mg | CHEWABLE_TABLET | ORAL | Status: DC | PRN

## 2015-05-21 MED ORDER — SENNOSIDES-DOCUSATE SODIUM 8.6-50 MG PO TABS
2.0000 | ORAL_TABLET | ORAL | Status: DC
  Administered 2015-05-21: 2 via ORAL
  Filled 2015-05-21: qty 2

## 2015-05-21 MED ORDER — CITRIC ACID-SODIUM CITRATE 334-500 MG/5ML PO SOLN: 30.0000 mL | ORAL | Status: DC | PRN

## 2015-05-21 MED ORDER — DEXTROSE 5 % IV SOLN
2.5000 10*6.[IU] | INTRAVENOUS | Status: DC
  Filled 2015-05-21 (×3): qty 2.5

## 2015-05-21 MED ORDER — ONDANSETRON HCL 4 MG/2ML IJ SOLN: 4.0000 mg | INTRAMUSCULAR | Status: DC | PRN

## 2015-05-21 MED ORDER — OXYCODONE-ACETAMINOPHEN 5-325 MG PO TABS: 2.0000 | ORAL_TABLET | ORAL | Status: DC | PRN

## 2015-05-21 MED ORDER — DIBUCAINE 1 % RE OINT: 1.0000 "application " | TOPICAL_OINTMENT | RECTAL | Status: DC | PRN

## 2015-05-21 MED ORDER — MAGNESIUM HYDROXIDE 400 MG/5ML PO SUSP: 30.0000 mL | ORAL | Status: DC | PRN

## 2015-05-21 MED ORDER — TETANUS-DIPHTH-ACELL PERTUSSIS 5-2.5-18.5 LF-MCG/0.5 IM SUSP: 0.5000 mL | Freq: Once | INTRAMUSCULAR | Status: DC

## 2015-05-21 MED ORDER — ACETAMINOPHEN 325 MG PO TABS: 650.0000 mg | ORAL_TABLET | ORAL | Status: DC | PRN

## 2015-05-21 MED ORDER — LIDOCAINE HCL (PF) 1 % IJ SOLN
30.0000 mL | INTRAMUSCULAR | Status: DC | PRN
Start: 2015-05-21 — End: 2015-05-21
  Filled 2015-05-21: qty 30

## 2015-05-21 MED ORDER — DIPHENHYDRAMINE HCL 25 MG PO CAPS: 25.0000 mg | ORAL_CAPSULE | Freq: Four times a day (QID) | ORAL | Status: DC | PRN

## 2015-05-21 MED ORDER — PRENATAL MULTIVITAMIN CH
1.0000 | ORAL_TABLET | Freq: Every day | ORAL | Status: DC
  Administered 2015-05-21 – 2015-05-22 (×2): 1 via ORAL
  Filled 2015-05-21 (×2): qty 1

## 2015-05-21 MED ORDER — ONDANSETRON HCL 4 MG/2ML IJ SOLN: 4.0000 mg | Freq: Four times a day (QID) | INTRAMUSCULAR | Status: DC | PRN

## 2015-05-21 MED ORDER — IBUPROFEN 600 MG PO TABS
600.0000 mg | ORAL_TABLET | Freq: Four times a day (QID) | ORAL | Status: DC
  Administered 2015-05-21 – 2015-05-22 (×6): 600 mg via ORAL
  Filled 2015-05-21 (×6): qty 1

## 2015-05-21 MED ORDER — METHYLERGONOVINE MALEATE 0.2 MG/ML IJ SOLN: 0.2000 mg | INTRAMUSCULAR | Status: DC | PRN

## 2015-05-21 MED ORDER — ZOLPIDEM TARTRATE 5 MG PO TABS: 5.0000 mg | ORAL_TABLET | Freq: Every evening | ORAL | Status: DC | PRN

## 2015-05-21 MED ORDER — ONDANSETRON HCL 4 MG PO TABS: 4.0000 mg | ORAL_TABLET | ORAL | Status: DC | PRN

## 2015-05-21 MED ORDER — LACTATED RINGERS IV SOLN: 500.0000 mL | INTRAVENOUS | Status: DC | PRN

## 2015-05-21 MED ORDER — BENZOCAINE-MENTHOL 20-0.5 % EX AERO
1.0000 | INHALATION_SPRAY | CUTANEOUS | Status: DC | PRN
Start: 2015-05-21 — End: 2015-05-22

## 2015-05-21 MED ORDER — OXYTOCIN 40 UNITS IN LACTATED RINGERS INFUSION - SIMPLE MED
62.5000 mL/h | INTRAVENOUS | Status: DC
Start: 2015-05-21 — End: 2015-05-21
  Filled 2015-05-21: qty 1000

## 2015-05-21 MED ORDER — FLEET ENEMA 7-19 GM/118ML RE ENEM: 1.0000 | ENEMA | RECTAL | Status: DC | PRN

## 2015-05-21 MED ORDER — OXYCODONE-ACETAMINOPHEN 5-325 MG PO TABS: 1.0000 | ORAL_TABLET | ORAL | Status: DC | PRN

## 2015-05-21 NOTE — MAU Note (Signed)
Pt reports contractions and bright red bleeding.

## 2015-05-21 NOTE — Lactation Note (Signed)
This note was copied from the chart of Felicia Syndi Shenoy. Lactation Consultation Note Initial visit at 12 hours of age.  Baby has had 5 feedings with most recent feeding 45 minutes.  Mom is holding baby STS with baby asleep.  Mom has experience with older children breastfeeding for 15 months each.  Mom denies pain or other concerns at this time.  [facility] given and discussed. Mom reports RN instructed her on hand expression.  Mom to call for assist as needed. LC to follow as needed.    Patient Name: Felicia Elenor Legatolexa Simmons ZOXWR'UToday's Date: 05/21/2015 Reason for consult: Initial assessment   Maternal Data Has patient been taught Hand Expression?: Yes (by RN per mom's report) Does the patient have breastfeeding experience prior to this delivery?: Yes  Feeding Feeding Type: Breast Fed Length of feed: 45 min  LATCH Score/Interventions Latch: Grasps breast easily, tongue down, lips flanged, rhythmical sucking.  Audible Swallowing: Spontaneous and intermittent Intervention(s): Skin to skin Intervention(s): Hand expression  Type of Nipple: Everted at rest and after stimulation  Comfort (Breast/Nipple): Soft / non-tender     Hold (Positioning): No assistance needed to correctly position infant at breast.  LATCH Score: 10  Lactation Tools Discussed/Used     Consult Status Consult Status: PRN    Jannifer RodneyShoptaw, Jana Lynn 05/21/2015, 5:03 PM

## 2015-05-21 NOTE — H&P (Signed)
Felicia Simmons is a 29 y.o. female, G3 P2002, EGA 37+ weeks with EDC 7-25 presenting for bleeding and ctx.  On eval in [facility], reg ctx, VE 5 cm dilated.  Pt admitted and has received PCN for +GBS, does not want pain medication.  Prenatal care uncomplicated.  Maternal Medical History:  Reason for admission: Contractions.   Contractions: Frequency: regular.   Perceived severity is strong.    Fetal activity: Perceived fetal activity is normal.    Prenatal complications: no prenatal complications   OB History    Gravida Para Term Preterm AB TAB SAB Ectopic Multiple Living   3 2 2       2      Past Medical History  Diagnosis Date  . Medical history non-contributory    Past Surgical History  Procedure Laterality Date  . Wisdom tooth extraction  2007   Family History: family history includes Heart disease in her maternal grandfather and paternal grandfather; Hypertension in her maternal grandfather and paternal grandfather; Stroke in her mother. Social History:  reports that she has never smoked. She has never used smokeless tobacco. She reports that she does not drink alcohol or use illicit drugs.   Prenatal Transfer Tool  Maternal Diabetes: No Genetic Screening: Declined Maternal Ultrasounds/Referrals: Normal Fetal Ultrasounds or other Referrals:  None Maternal Substance Abuse:  No Significant Maternal Medications:  None Significant Maternal Lab Results:  Lab values include: Group B Strep positive Other Comments:  None  Review of Systems  Respiratory: Negative.   Cardiovascular: Negative.    AROM-clear Dilation: 9 Effacement (%): 100 Station: -1 Exam by:: V Rogers RN  Blood pressure 105/65, pulse 77, temperature 98.8 F (37.1 C), temperature source Oral, resp. rate 18, height 5\' 9"  (1.753 m), weight 68.493 kg (151 lb), SpO2 100 %, unknown if currently breastfeeding. Maternal Exam:  Uterine Assessment: Contraction strength is moderate.  Contraction frequency is regular.    Abdomen: Patient reports no abdominal tenderness. Estimated fetal weight is 8 lbs.   Fetal presentation: vertex  Introitus: Normal vulva. Normal vagina.  Amniotic fluid character: clear.  Pelvis: adequate for delivery.   Cervix: Cervix evaluated by digital exam.     Fetal Exam Fetal Monitor Review: Mode: ultrasound.   Baseline rate: 130.  Variability: moderate (6-25 bpm).   Pattern: accelerations present and no decelerations.    Fetal State Assessment: Category I - tracings are normal.     Physical Exam  Vitals reviewed. Constitutional: She appears well-developed and well-nourished.  Cardiovascular: Normal rate, regular rhythm and normal heart sounds.   No murmur heard. Respiratory: Effort normal and breath sounds normal. No respiratory distress. She has no wheezes.  GI: Soft.    Prenatal labs: ABO, Rh: --/--/O POS (07/06 0200) Antibody: NEG (07/06 0200) Rubella: Immune (12/16 0000) RPR: Nonreactive (12/16 0000)  HBsAg: Negative (12/16 0000)  HIV: Non-reactive (12/16 0000)  GBS: Positive (06/22 0000)   Assessment/Plan: IUP at 37+ weeks in active labor, +GBS.  Has received PCN, making progress, anticipate SVD.   Derrious Bologna D 05/21/2015, 4:36 AM

## 2015-05-22 MED ORDER — IBUPROFEN 600 MG PO TABS: 600.0000 mg | ORAL_TABLET | Freq: Four times a day (QID) | ORAL | Status: DC

## 2015-05-22 MED ORDER — OXYCODONE-ACETAMINOPHEN 5-325 MG PO TABS: 1.0000 | ORAL_TABLET | ORAL | Status: DC | PRN

## 2015-05-22 NOTE — Progress Notes (Signed)
Post Partum Day 1 Subjective: no complaints and tolerating PO.  Pt requests early d/c  Objective: Blood pressure 97/57, pulse 60, temperature 98.2 F (36.8 C), temperature source Oral, resp. rate 18, height 5\' 9"  (1.753 m), weight 68.493 kg (151 lb), SpO2 98 %, unknown if currently breastfeeding.  Physical Exam:  General: alert and cooperative Lochia: appropriate Uterine Fundus: firm   Recent Labs  05/21/15 0200  HGB 10.9*  HCT 31.3*    Assessment/Plan: Discharge home if baby able to go. Percocet and motrin   LOS: 1 day   Alante Weimann W 05/22/2015, 8:30 AM

## 2015-05-22 NOTE — Discharge Summary (Signed)
Obstetric Discharge Summary Reason for Admission: onset of labor Prenatal Procedures: none Intrapartum Procedures: spontaneous vaginal delivery and GBS prophylaxis Postpartum Procedures: none Complications-Operative and Postpartum: none HEMOGLOBIN  Date Value Ref Range Status  05/21/2015 10.9* 12.0 - 15.0 g/dL Final   HCT  Date Value Ref Range Status  05/21/2015 31.3* 36.0 - 46.0 % Final    Physical Exam:  General: alert and cooperative Lochia: appropriate Uterine Fundus: firm   Discharge Diagnoses: Term Pregnancy-delivered  Discharge Information: Date: 05/22/2015 Activity: pelvic rest Diet: routine Medications: Ibuprofen and Percocet Condition: improved Instructions: refer to practice specific booklet Discharge to: home Follow-up Information    Follow up with Oliver PilaICHARDSON,Teneka Malmberg W, MD. Schedule an appointment as soon as possible for a visit in 6 weeks.   Specialty:  Obstetrics and Gynecology   Why:  postpartum   Contact information:   [address] [place] [id number] [phone]       Newborn Data: Live born female  Birth Weight: 7 lb 7.8 oz (3395 g) APGAR: 8, 9  Home with mother.  Oliver PilaRICHARDSON,Latifa Noble W 05/22/2015, 8:34 AM

## 2017-01-12 ENCOUNTER — Other Ambulatory Visit (HOSPITAL_COMMUNITY): Payer: Self-pay | Admitting: Certified Nurse Midwife

## 2017-01-12 DIAGNOSIS — Z3480 Encounter for supervision of other normal pregnancy, unspecified trimester: Secondary | ICD-10-CM

## 2017-01-21 ENCOUNTER — Encounter (HOSPITAL_COMMUNITY): Payer: Self-pay | Admitting: Radiology

## 2017-01-21 ENCOUNTER — Ambulatory Visit (HOSPITAL_COMMUNITY)
Admission: RE | Admit: 2017-01-21 | Discharge: 2017-01-21 | Disposition: A | Discharge status: Home / Self Care | Payer: Self-pay | Source: Ambulatory Visit | Attending: Certified Nurse Midwife | Admitting: Certified Nurse Midwife

## 2017-01-21 DIAGNOSIS — Z3A09 9 weeks gestation of pregnancy: Secondary | ICD-10-CM | POA: Insufficient documentation

## 2017-01-21 DIAGNOSIS — Z3480 Encounter for supervision of other normal pregnancy, unspecified trimester: Secondary | ICD-10-CM

## 2017-01-21 DIAGNOSIS — O283 Abnormal ultrasonic finding on antenatal screening of mother: Secondary | ICD-10-CM | POA: Insufficient documentation

## 2017-03-11 ENCOUNTER — Other Ambulatory Visit (HOSPITAL_COMMUNITY): Payer: Self-pay

## 2017-03-11 DIAGNOSIS — Z3402 Encounter for supervision of normal first pregnancy, second trimester: Secondary | ICD-10-CM

## 2017-03-25 ENCOUNTER — Ambulatory Visit (HOSPITAL_COMMUNITY)
Admission: RE | Admit: 2017-03-25 | Discharge: 2017-03-25 | Disposition: A | Discharge status: Home / Self Care | Payer: Self-pay | Source: Ambulatory Visit

## 2017-03-25 ENCOUNTER — Encounter (HOSPITAL_COMMUNITY): Payer: Self-pay

## 2017-03-25 DIAGNOSIS — Z3402 Encounter for supervision of normal first pregnancy, second trimester: Secondary | ICD-10-CM

## 2017-03-25 DIAGNOSIS — Z3689 Encounter for other specified antenatal screening: Secondary | ICD-10-CM | POA: Insufficient documentation

## 2017-03-25 DIAGNOSIS — Z3A18 18 weeks gestation of pregnancy: Secondary | ICD-10-CM | POA: Insufficient documentation

## 2017-03-28 ENCOUNTER — Ambulatory Visit (HOSPITAL_COMMUNITY): Payer: Self-pay

## 2017-03-29 ENCOUNTER — Other Ambulatory Visit (HOSPITAL_COMMUNITY): Payer: Self-pay

## 2017-03-29 ENCOUNTER — Encounter (HOSPITAL_COMMUNITY): Payer: Self-pay

## 2017-03-29 DIAGNOSIS — Z3689 Encounter for other specified antenatal screening: Secondary | ICD-10-CM

## 2017-04-29 ENCOUNTER — Ambulatory Visit (HOSPITAL_COMMUNITY)
Admission: RE | Admit: 2017-04-29 | Discharge: 2017-04-29 | Disposition: A | Discharge status: Home / Self Care | Payer: Self-pay | Source: Ambulatory Visit

## 2017-04-29 ENCOUNTER — Other Ambulatory Visit (HOSPITAL_COMMUNITY): Payer: Self-pay

## 2017-04-29 DIAGNOSIS — Z3482 Encounter for supervision of other normal pregnancy, second trimester: Secondary | ICD-10-CM

## 2017-04-29 DIAGNOSIS — Z3A23 23 weeks gestation of pregnancy: Secondary | ICD-10-CM

## 2017-04-29 DIAGNOSIS — IMO0002 Reserved for concepts with insufficient information to code with codable children: Secondary | ICD-10-CM

## 2017-04-29 DIAGNOSIS — Z0489 Encounter for examination and observation for other specified reasons: Secondary | ICD-10-CM

## 2017-04-29 DIAGNOSIS — Z3689 Encounter for other specified antenatal screening: Secondary | ICD-10-CM

## 2017-04-29 DIAGNOSIS — Z362 Encounter for other antenatal screening follow-up: Secondary | ICD-10-CM | POA: Insufficient documentation

## 2017-08-26 ENCOUNTER — Inpatient Hospital Stay (HOSPITAL_COMMUNITY): Admission: AD | Admit: 2017-08-26 | Payer: Self-pay | Source: Ambulatory Visit | Admitting: Obstetrics and Gynecology

## 2018-05-16 ENCOUNTER — Ambulatory Visit: Payer: 59 | Admitting: Family Medicine

## 2018-05-16 ENCOUNTER — Encounter: Payer: Self-pay | Admitting: Family Medicine

## 2018-05-16 VITALS — BP 116/60 | HR 76 | Temp 98.3°F | Ht 68.0 in | Wt 120.8 lb

## 2018-05-16 DIAGNOSIS — R197 Diarrhea, unspecified: Secondary | ICD-10-CM

## 2018-05-16 DIAGNOSIS — R634 Abnormal weight loss: Secondary | ICD-10-CM

## 2018-05-16 NOTE — Progress Notes (Signed)
Subjective:     Patient ID: Felicia Simmons, female   DOB: 1986-08-22, 32 y.o.   MRN: [id number]  HPI Patient seen to reestablish care and with chief complaint of two-week history of diarrhea. They had a big family reunion with over 59 family members over 2 weeks ago.   2 weeks ago yesterday she had onset of watery diarrhea. She has 4 children and 3 of the 4 children also had diarrhea. Two of them still have some intermittent diarrhea. One of them was evaluated by other pediatrician a few days ago. They submitted a stool specimen yesterday.  She thinks she may have had some low-grade fever initially but has not had any documented fever past several days. She has intermittent abdominal cramps. No bloody stools. No nausea or vomiting since Saturday. No recent travels other than trip to [place] as above. No recent antibiotics.  She tried some Imodium a couple times without much improvement. No family history of inflammatory bowel disease. Patient has no history of IBS.  Diarrhea has been somewhat intermittent. She thought she was getting better couple of days ago but then yesterday had up to 20 watery stools. Is keeping down fluids. She has taken some electrolyte replacement  Generally very healthy. Takes no regular medications. She has lost about 6 pounds over the past couple weeks which she attributes to diarrhea and decreased intake  Past Medical History:  Diagnosis Date  . Medical history non-contributory    Past Surgical History:  Procedure Laterality Date  . WISDOM TOOTH EXTRACTION  2007    reports that she has never smoked. She has never used smokeless tobacco. She reports that she does not drink alcohol or use drugs. family history includes Heart disease in her maternal grandfather and paternal grandfather; Hypertension in her maternal grandfather and paternal grandfather; Stroke in her mother. No Known Allergies   Review of Systems  Constitutional: Positive for appetite change and  fatigue. Negative for chills and fever.  Respiratory: Negative for cough.   Cardiovascular: Negative for chest pain.  Gastrointestinal: Positive for diarrhea. Negative for abdominal distention, anal bleeding, blood in stool, constipation and vomiting.       Intermittent diffuse abdominal cramping but no localizing pain  Genitourinary: Negative for dysuria.  Skin: Negative for rash.  Neurological: Negative for syncope.       Objective:   Physical Exam  Constitutional: She appears well-developed and well-nourished.  HENT:  Mouth/Throat: Oropharynx is clear and moist. No oropharyngeal exudate.  Neck: Neck supple.  Cardiovascular: Normal rate and regular rhythm.  Pulmonary/Chest: Effort normal and breath sounds normal. She has no wheezes. She has no rales.  Abdominal: Soft. Bowel sounds are normal. She exhibits no distension and no mass. There is no tenderness. There is no rebound and no guarding.       Assessment:     Patient presents with 2 week history of nonbloody diarrhea. Very likely infectious given the fact that 3 of her 4 children also had diarrhea. She has not had any red flags such as persistent fever, bloody stools, international travel, recent antibiotic use.  She does not have any history of gluten intolerance or lactose intolerance.    Plan:     -Obtain further labs with CBC with differential, comprehensive metabolic panel, TSH, GI pathogen panel, C. Difficile -We elected not to check other things such as celiac antibodies or fecal calpprotectin given the fact this seems to be more acute diarrhea and with several family members with this as well.  -  Follow-up immediately for any fever, bloody stools, or other changes symptoms. Also instructed to follow-up if diarrhea not fully resolving in the next week  Felicia CoveyBruce W Brighton Delio MD [facility]

## 2018-05-16 NOTE — Patient Instructions (Signed)
Food Choices to Help Relieve Diarrhea, Adult  When you have diarrhea, the foods you eat and your eating habits are very important. Choosing the right foods and drinks can help:  · Relieve diarrhea.  · Replace lost fluids and nutrients.  · Prevent dehydration.    What general guidelines should I follow?  Relieving diarrhea  · Choose foods with less than 2 g or .07 oz. of fiber per serving.  · Limit fats to less than 8 tsp (38 g or 1.34 oz.) a day.  · Avoid the following:  ? Foods and beverages sweetened with high-fructose corn syrup, honey, or sugar alcohols such as xylitol, sorbitol, and mannitol.  ? Foods that contain a lot of fat or sugar.  ? Fried, greasy, or spicy foods.  ? High-fiber grains, breads, and cereals.  ? Raw fruits and vegetables.  · Eat foods that are rich in probiotics. These foods include dairy products such as yogurt and fermented milk products. They help increase healthy bacteria in the stomach and intestines (gastrointestinal tract, or GI tract).  · If you have lactose intolerance, avoid dairy products. These may make your diarrhea worse.  · Take medicine to help stop diarrhea (antidiarrheal medicine) only as told by your health care provider.  Replacing nutrients  · Eat small meals or snacks every 3–4 hours.  · Eat bland foods, such as white rice, toast, or baked potato, until your diarrhea starts to get better. Gradually reintroduce nutrient-rich foods as tolerated or as told by your health care provider. This includes:  ? Well-cooked protein foods.  ? Peeled, seeded, and soft-cooked fruits and vegetables.  ? Low-fat dairy products.  · Take vitamin and mineral supplements as told by your health care provider.  Preventing dehydration    · Start by sipping water or a special solution to prevent dehydration (oral rehydration solution, ORS). Urine that is clear or pale yellow means that you are getting enough fluid.  · Try to drink at least 8–10 cups of fluid each day to help replace lost  fluids.  · You may add other liquids in addition to water, such as clear juice or decaffeinated sports drinks, as tolerated or as told by your health care provider.  · Avoid drinks with caffeine, such as coffee, tea, or soft drinks.  · Avoid alcohol.  What foods are recommended?  The items listed may not be a complete list. Talk with your health care provider about what dietary choices are best for you.  Grains  White rice. White, French, or pita breads (fresh or toasted), including plain rolls, buns, or bagels. White pasta. Saltine, soda, or graham crackers. Pretzels. Low-fiber cereal. Cooked cereals made with water (such as cornmeal, farina, or cream cereals). Plain muffins. Matzo. Melba toast. Zwieback.  Vegetables  Potatoes (without the skin). Most well-cooked and canned vegetables without skins or seeds. Tender lettuce.  Fruits  Apple sauce. Fruits canned in juice. Cooked apricots, cherries, grapefruit, peaches, pears, or plums. Fresh bananas and cantaloupe.  Meats and other protein foods  Baked or boiled chicken. Eggs. Tofu. Fish. Seafood. Smooth nut butters. Ground or well-cooked tender beef, ham, veal, lamb, pork, or poultry.  Dairy  Plain yogurt, kefir, and unsweetened liquid yogurt. Lactose-free milk, buttermilk, skim milk, or soy milk. Low-fat or nonfat hard cheese.  Beverages  Water. Low-calorie sports drinks. Fruit juices without pulp. Strained tomato and vegetable juices. Decaffeinated teas. Sugar-free beverages not sweetened with sugar alcohols. Oral rehydration solutions, if approved by your health care   provider.  Seasoning and other foods  Bouillon, broth, or soups made from recommended foods.  What foods are not recommended?  The items listed may not be a complete list. Talk with your health care provider about what dietary choices are best for you.  Grains  Whole grain, whole wheat, bran, or rye breads, rolls, pastas, and crackers. Wild or brown rice. Whole grain or bran cereals. Barley. Oats  and oatmeal. Corn tortillas or taco shells. Granola. Popcorn.  Vegetables  Raw vegetables. Fried vegetables. Cabbage, broccoli, Brussels sprouts, artichokes, baked beans, beet greens, corn, kale, legumes, peas, sweet potatoes, and yams. Potato skins. Cooked spinach and cabbage.  Fruits  Dried fruit, including raisins and dates. Raw fruits. Stewed or dried prunes. Canned fruits with syrup.  Meat and other protein foods  Fried or fatty meats. Deli meats. Chunky nut butters. Nuts and seeds. Beans and lentils. Bacon. Hot dogs. Sausage.  Dairy  High-fat cheeses. Whole milk, chocolate milk, and beverages made with milk, such as milk shakes. Half-and-half. Cream. sour cream. Ice cream.  Beverages  Caffeinated beverages (such as coffee, tea, soda, or energy drinks). Alcoholic beverages. Fruit juices with pulp. Prune juice. Soft drinks sweetened with high-fructose corn syrup or sugar alcohols. High-calorie sports drinks.  Fats and oils  Butter. Cream sauces. Margarine. Salad oils. Plain salad dressings. Olives. Avocados. Mayonnaise.  Sweets and desserts  Sweet rolls, doughnuts, and sweet breads. Sugar-free desserts sweetened with sugar alcohols such as xylitol and sorbitol.  Seasoning and other foods  Honey. Hot sauce. Chili powder. Gravy. Cream-based or milk-based soups. Pancakes and waffles.  Summary  · When you have diarrhea, the foods you eat and your eating habits are very important.  · Make sure you get at least 8–10 cups of fluid each day, or enough to keep your urine clear or pale yellow.  · Eat bland foods and gradually reintroduce healthy, nutrient-rich foods as tolerated, or as told by your health care provider.  · Avoid high-fiber, fried, greasy, or spicy foods.  This information is not intended to replace advice given to you by your health care provider. Make sure you discuss any questions you have with your health care provider.  Document Released: 01/22/2004 Document Revised: 10/29/2016 Document  Reviewed: 10/29/2016  Elsevier Interactive Patient Education © 2018 [employer] Inc[employer]

## 2018-05-17 ENCOUNTER — Ambulatory Visit: Payer: Self-pay | Admitting: Family Medicine

## 2018-05-17 LAB — CBC WITH DIFFERENTIAL/PLATELET
BASOS ABS: 0 10*3/uL (ref 0.0–0.1)
Basophils Relative: 0.3 % (ref 0.0–3.0)
EOS PCT: 2.1 % (ref 0.0–5.0)
Eosinophils Absolute: 0.1 10*3/uL (ref 0.0–0.7)
HEMATOCRIT: 33.7 % — AB (ref 36.0–46.0)
HEMOGLOBIN: 12.2 g/dL (ref 12.0–15.0)
LYMPHS ABS: 2.4 10*3/uL (ref 0.7–4.0)
Lymphocytes Relative: 49.2 % — ABNORMAL HIGH (ref 12.0–46.0)
MCHC: 36 g/dL (ref 30.0–36.0)
MCV: 86.8 fl (ref 78.0–100.0)
Monocytes Absolute: 0.7 10*3/uL (ref 0.1–1.0)
Monocytes Relative: 13.6 % — ABNORMAL HIGH (ref 3.0–12.0)
Neutro Abs: 1.7 10*3/uL (ref 1.4–7.7)
Neutrophils Relative %: 34.8 % — ABNORMAL LOW (ref 43.0–77.0)
Platelets: 145 10*3/uL — ABNORMAL LOW (ref 150.0–400.0)
RBC: 3.89 Mil/uL (ref 3.87–5.11)
RDW: 12.7 % (ref 11.5–15.5)
WBC: 5 10*3/uL (ref 4.0–10.5)

## 2018-05-17 LAB — COMPREHENSIVE METABOLIC PANEL
ALBUMIN: 4 g/dL (ref 3.5–5.2)
ALT: 26 U/L (ref 0–35)
AST: 23 U/L (ref 0–37)
Alkaline Phosphatase: 62 U/L (ref 39–117)
BUN: 12 mg/dL (ref 6–23)
CALCIUM: 8.9 mg/dL (ref 8.4–10.5)
CO2: 26 mEq/L (ref 19–32)
Chloride: 108 mEq/L (ref 96–112)
Creatinine, Ser: 0.85 mg/dL (ref 0.40–1.20)
GFR: 82.56 mL/min (ref >60.00)
Glucose, Bld: 93 mg/dL (ref 70–99)
POTASSIUM: 3.5 meq/L (ref 3.5–5.1)
Sodium: 141 mEq/L (ref 135–145)
Total Bilirubin: 0.5 mg/dL (ref 0.2–1.2)
Total Protein: 6.6 g/dL (ref 6.0–8.3)

## 2018-05-17 LAB — TSH: TSH: 1.27 u[IU]/mL (ref 0.35–4.50)

## 2018-05-29 ENCOUNTER — Other Ambulatory Visit: Payer: Self-pay | Admitting: *Deleted

## 2018-05-29 LAB — GASTROINTESTINAL PATHOGEN PANEL PCR
C. difficile Tox A/B, PCR: NOT DETECTED
CRYPTOSPORIDIUM, PCR: NOT DETECTED
Campylobacter, PCR: NOT DETECTED
E COLI 0157, PCR: NOT DETECTED
E coli (ETEC) LT/ST PCR: NOT DETECTED
E coli (STEC) stx1/stx2, PCR: NOT DETECTED
GIARDIA LAMBLIA, PCR: NOT DETECTED
NOROVIRUS, PCR: NOT DETECTED
Rotavirus A, PCR: NOT DETECTED
Salmonella, PCR: NOT DETECTED
Shigella, PCR: NOT DETECTED

## 2018-05-29 MED ORDER — DICYCLOMINE HCL 20 MG PO TABS: 20.0000 mg | ORAL_TABLET | Freq: Three times a day (TID) | ORAL | 0 refills | Status: DC | PRN

## 2018-11-15 NOTE — L&D Delivery Note (Signed)
Delivery Note:   [id number] at [redacted]w[redacted]d  Admitting diagnosis: Normal labor [O80, Z37.9] Risks: gest. Thrombocytopenia  GBS positive, PCN x 2 doses given prior to delivery Onset of labor: early labor 12/26 200   Active labor 12/27 1900 Augmentation: nipple stim, membrane sweep ROM: 12/27 2109 clear AF  Complete dilation at 11/11/2019  2102 Onset of pushing at 2055 FHR second stage Category I  Analgesia /Anesthesia intrapartum:None  Pushing in L and R lateral, anterior lip reduced then returned to [facility] for delivery.CNM and L&D staff support for 2nd stage, FOB Roderic Palau present for birth and supportive.  Delivery of a Live born female, cry with stimulation, vigorous Birth Weight:  pending APGAR: 65, 9  Newborn Delivery   Birth date/time: 11/11/2019 21:22:00 Delivery type: Vaginal, Spontaneous      in cephallic presentation, position ROA to ROT.   Nuchal Cord: No  Cord double clamped after cessation of pulsation, cut by FOB.  Collection of cord blood for typing completed. Cord blood donation-None  Arterial cord blood sample-No    Placenta delivered-Spontaneous , Duncan with 3 vessels . Uterotonics: none Placenta to L&D for disposal. Uterine tone firm, bleeding light  None  laceration identified.  Episiotomy:None  Local analgesia: NA  Est. Blood Loss (JK):932.67   Complications: None  APGAR:1 min-8 , 5 min-9  10 min-   Mom to postpartum.  Baby John to Couplet care / Skin to Skin.  Delivery Report:  Review the Delivery Report for details.     Signed: Juliene Pina, CNM, MSN 11/11/2019, 9:41 PM

## 2018-11-29 ENCOUNTER — Ambulatory Visit: Payer: 59 | Admitting: Family Medicine

## 2018-11-29 ENCOUNTER — Encounter: Payer: Self-pay | Admitting: Family Medicine

## 2018-11-29 ENCOUNTER — Encounter

## 2018-11-29 VITALS — BP 82/50 | HR 70 | Temp 98.0°F | Ht 68.0 in | Wt 131.1 lb

## 2018-11-29 DIAGNOSIS — B029 Zoster without complications: Secondary | ICD-10-CM

## 2018-11-29 NOTE — Patient Instructions (Signed)
Shingles    Shingles, which is also known as herpes zoster, is an infection that causes a painful skin rash and fluid-filled blisters. It is caused by a virus.  Shingles only develops in people who:   Have had chickenpox.   Have been given a medicine to protect against chickenpox (have been vaccinated). Shingles is rare in this group.  What are the causes?  Shingles is caused by varicella-zoster virus (VZV). This is the same virus that causes chickenpox. After a person is exposed to VZV, the virus stays in the body in an inactive (dormant) state. Shingles develops if the virus is reactivated. This can happen many years after the first (initial) exposure to VZV. It is not known what causes this virus to be reactivated.  What increases the risk?  People who have had chickenpox or received the chickenpox vaccine are at risk for shingles. Shingles infection is more common in people who:   Are older than age 60.   Have a weakened disease-fighting system (immune system), such as people with:  ? HIV.  ? AIDS.  ? Cancer.   Are taking medicines that weaken the immune system, such as transplant medicines.   Are experiencing a lot of stress.  What are the signs or symptoms?  Early symptoms of this condition include itching, tingling, and pain in an area on your skin. Pain may be described as burning, stabbing, or throbbing.  A few days or weeks after early symptoms start, a painful red rash appears. The rash is usually on one side of the body and has a band-like or belt-like pattern. The rash eventually turns into fluid-filled blisters that break open, change into scabs, and dry up in about 2-3 weeks.  At any time during the infection, you may also develop:   A fever.   Chills.   A headache.   An upset stomach.  How is this diagnosed?  This condition is diagnosed with a skin exam. Skin or fluid samples may be taken from the blisters before a diagnosis is made. These samples are examined under a microscope or sent to  a lab for testing.  How is this treated?  The rash may last for several weeks. There is not a specific cure for this condition. Your health care provider will probably prescribe medicines to help you manage pain, recover more quickly, and avoid long-term problems. Medicines may include:   Antiviral drugs.   Anti-inflammatory drugs.   Pain medicines.   Anti-itching medicines (antihistamines).  If the area involved is on your face, you may be referred to a specialist, such as an eye doctor (ophthalmologist) or an ear, nose, and throat (ENT) doctor (otolaryngologist) to help you avoid eye problems, chronic pain, or disability.  Follow these instructions at home:  Medicines   Take over-the-counter and prescription medicines only as told by your health care provider.   Apply an anti-itch cream or numbing cream to the affected area as told by your health care provider.  Relieving itching and discomfort     Apply cold, wet cloths (cold compresses) to the area of the rash or blisters as told by your health care provider.   Cool baths can be soothing. Try adding baking soda or dry oatmeal to the water to reduce itching. Do not bathe in hot water.  Blister and rash care   Keep your rash covered with a loose bandage (dressing). Wear loose-fitting clothing to help ease the pain of material rubbing against the rash.     Keep your rash and blisters clean by washing the area with mild soap and cool water as told by your health care provider.   Check your rash every day for signs of infection. Check for:  ? More redness, swelling, or pain.  ? Fluid or blood.  ? Warmth.  ? Pus or a bad smell.   Do not scratch your rash or pick at your blisters. To help avoid scratching:  ? Keep your fingernails clean and cut short.  ? Wear gloves or mittens while you sleep, if scratching is a problem.  General instructions   Rest as told by your health care provider.   Keep all follow-up visits as told by your health care provider. This  is important.   Wash your hands often with soap and water. If soap and water are not available, use hand sanitizer. Doing this lowers your chance of getting a bacterial skin infection.   Before your blisters change into scabs, your shingles infection can cause chickenpox in people who have never had it or have never been vaccinated against it. To prevent this from happening, avoid contact with other people, especially:  ? Babies.  ? Pregnant women.  ? Children who have eczema.  ? Elderly people who have transplants.  ? People who have chronic illnesses, such as cancer or AIDS.  Contact a health care provider if:   Your pain is not relieved with prescribed medicines.   Your pain does not get better after the rash heals.   You have signs of infection in the rash area, such as:  ? More redness, swelling, or pain around the rash.  ? Fluid or blood coming from the rash.  ? The rash area feeling warm to the touch.  ? Pus or a bad smell coming from the rash.  Get help right away if:   The rash is on your face or nose.   You have facial pain, pain around your eye area, or loss of feeling on one side of your face.   You have difficulty seeing.   You have ear pain or have ringing in your ear.   You have a loss of taste.   Your condition gets worse.  Summary   Shingles, which is also known as herpes zoster, is an infection that causes a painful skin rash and fluid-filled blisters.   This condition is diagnosed with a skin exam. Skin or fluid samples may be taken from the blisters and examined before the diagnosis is made.   Keep your rash covered with a loose bandage (dressing). Wear loose-fitting clothing to help ease the pain of material rubbing against the rash.   Before your blisters change into scabs, your shingles infection can cause chickenpox in people who have never had it or have never been vaccinated against it.  This information is not intended to replace advice given to you by your health care  provider. Make sure you discuss any questions you have with your health care provider.  Document Released: 11/01/2005 Document Revised: 07/06/2017 Document Reviewed: 07/06/2017  Elsevier Interactive Patient Education  2019 [employer]vier Inc.

## 2018-11-29 NOTE — Progress Notes (Signed)
  Subjective:     Patient ID: Felicia Simmons, female   DOB: 20-Aug-1986, 33 y.o.   MRN: [id number]  HPI  Patient is seen with blistery slightly painful slightly itchy rash left thoracic area.  First came about a month ago.  She did a "tele-doc" visit yesterday and was diagnosed with shingles and prescribed Valtrex.  Her pain is fairly tolerable.  She does not have any fever or other systemic complaints.  Is generally very healthy.  She has 4 children but they have all had shingles vaccine  Past Medical History:  Diagnosis Date  . Medical history non-contributory    Past Surgical History:  Procedure Laterality Date  . WISDOM TOOTH EXTRACTION  2007    reports that she has never smoked. She has never used smokeless tobacco. She reports that she does not drink alcohol or use drugs. family history includes Heart disease in her maternal grandfather and paternal grandfather; Hypertension in her maternal grandfather and paternal grandfather; Stroke in her mother. No Known Allergies  Review of Systems  Constitutional: Negative for chills and fever.  Skin: Positive for rash.       Objective:   Physical Exam Constitutional:      Appearance: Normal appearance.  Skin:    Findings: Rash present.     Comments: She has scattered rash left thoracic distribution with a couple of patches which are small vesicles that are starting to crust.  These follow a dermatome distribution  Neurological:     Mental Status: She is alert.        Assessment:     Shingles in a thoracic distribution.  Minimal pain    Plan:     -She is already on Valtrex and we explained this is probably not doing much as she was started on this about a week into the shingles.  She is not needing anything else further for pain control and will take over-the-counter Tylenol or ibuprofen as needed.  Touch base for any prolonged pain or other concerns  Kristian Covey MD [facility] at [facility]

## 2019-11-11 ENCOUNTER — Inpatient Hospital Stay (HOSPITAL_COMMUNITY)
Admission: AD | Admit: 2019-11-11 | Discharge: 2019-11-12 | DRG: 807 | Disposition: A | Discharge status: Home / Self Care | Payer: 59

## 2019-11-11 ENCOUNTER — Other Ambulatory Visit: Payer: Self-pay

## 2019-11-11 ENCOUNTER — Encounter (HOSPITAL_COMMUNITY): Payer: Self-pay | Admitting: Obstetrics and Gynecology

## 2019-11-11 DIAGNOSIS — O99119 Other diseases of the blood and blood-forming organs and certain disorders involving the immune mechanism complicating pregnancy, unspecified trimester: Secondary | ICD-10-CM | POA: Diagnosis present

## 2019-11-11 DIAGNOSIS — D649 Anemia, unspecified: Secondary | ICD-10-CM | POA: Diagnosis present

## 2019-11-11 DIAGNOSIS — Z20828 Contact with and (suspected) exposure to other viral communicable diseases: Secondary | ICD-10-CM | POA: Diagnosis present

## 2019-11-11 DIAGNOSIS — Z3A40 40 weeks gestation of pregnancy: Secondary | ICD-10-CM

## 2019-11-11 DIAGNOSIS — D6959 Other secondary thrombocytopenia: Secondary | ICD-10-CM | POA: Diagnosis present

## 2019-11-11 DIAGNOSIS — O9912 Other diseases of the blood and blood-forming organs and certain disorders involving the immune mechanism complicating childbirth: Principal | ICD-10-CM | POA: Diagnosis present

## 2019-11-11 DIAGNOSIS — O26893 Other specified pregnancy related conditions, third trimester: Secondary | ICD-10-CM | POA: Diagnosis present

## 2019-11-11 DIAGNOSIS — O9902 Anemia complicating childbirth: Secondary | ICD-10-CM | POA: Diagnosis present

## 2019-11-11 DIAGNOSIS — D696 Thrombocytopenia, unspecified: Secondary | ICD-10-CM | POA: Diagnosis present

## 2019-11-11 DIAGNOSIS — O99824 Streptococcus B carrier state complicating childbirth: Secondary | ICD-10-CM | POA: Diagnosis present

## 2019-11-11 LAB — CBC
HCT: 33 % — ABNORMAL LOW (ref 36.0–46.0)
Hemoglobin: 11.6 g/dL — ABNORMAL LOW (ref 12.0–15.0)
MCH: 33.3 pg (ref 26.0–34.0)
MCHC: 35.2 g/dL (ref 30.0–36.0)
MCV: 94.8 fL (ref 80.0–100.0)
Platelets: 121 10*3/uL — ABNORMAL LOW (ref 150–400)
RBC: 3.48 MIL/uL — ABNORMAL LOW (ref 3.87–5.11)
RDW: 12.8 % (ref 11.5–15.5)
WBC: 9 10*3/uL (ref 4.0–10.5)
nRBC: 0 % (ref 0.0–0.2)

## 2019-11-11 LAB — TYPE AND SCREEN
ABO/RH(D): O POS
Antibody Screen: NEGATIVE

## 2019-11-11 LAB — RESPIRATORY PANEL BY RT PCR (FLU A&B, COVID)
Influenza A by PCR: NEGATIVE
Influenza B by PCR: NEGATIVE
SARS Coronavirus 2 by RT PCR: NEGATIVE

## 2019-11-11 LAB — ABO/RH: ABO/RH(D): O POS

## 2019-11-11 MED ORDER — SODIUM CHLORIDE 0.9 % IV SOLN
5.0000 10*6.[IU] | Freq: Once | INTRAVENOUS | Status: AC
  Administered 2019-11-11: 5 10*6.[IU] via INTRAVENOUS
  Filled 2019-11-11: qty 5

## 2019-11-11 MED ORDER — OXYTOCIN 10 UNIT/ML IJ SOLN
10.0000 [IU] | Freq: Once | INTRAMUSCULAR | Status: DC
  Filled 2019-11-11: qty 1

## 2019-11-11 MED ORDER — PENICILLIN G POT IN DEXTROSE 60000 UNIT/ML IV SOLN
3.0000 10*6.[IU] | INTRAVENOUS | Status: DC
  Administered 2019-11-11: 3 10*6.[IU] via INTRAVENOUS
  Filled 2019-11-11: qty 50

## 2019-11-11 MED ORDER — LACTATED RINGERS IV SOLN: 500.0000 mL | INTRAVENOUS | Status: DC | PRN

## 2019-11-11 MED ORDER — LACTATED RINGERS IV SOLN: INTRAVENOUS | Status: DC

## 2019-11-11 MED ORDER — SOD CITRATE-CITRIC ACID 500-334 MG/5ML PO SOLN
30.0000 mL | ORAL | Status: DC | PRN
  Administered 2019-11-11: 30 mL via ORAL
  Filled 2019-11-11: qty 30

## 2019-11-11 MED ORDER — LIDOCAINE HCL (PF) 1 % IJ SOLN: 30.0000 mL | INTRAMUSCULAR | Status: DC | PRN

## 2019-11-11 MED ORDER — SODIUM CHLORIDE 0.9 % IV SOLN: 250.0000 mL | INTRAVENOUS | Status: DC | PRN

## 2019-11-11 MED ORDER — OXYCODONE HCL 5 MG PO TABS
5.0000 mg | ORAL_TABLET | ORAL | Status: DC | PRN
  Administered 2019-11-11 – 2019-11-12 (×3): 5 mg via ORAL
  Filled 2019-11-11 (×3): qty 1

## 2019-11-11 MED ORDER — ONDANSETRON HCL 4 MG/2ML IJ SOLN: 4.0000 mg | Freq: Four times a day (QID) | INTRAMUSCULAR | Status: DC | PRN

## 2019-11-11 MED ORDER — SODIUM CHLORIDE 0.9% FLUSH: 3.0000 mL | Freq: Two times a day (BID) | INTRAVENOUS | Status: DC

## 2019-11-11 MED ORDER — OXYTOCIN 40 UNITS IN NORMAL SALINE INFUSION - SIMPLE MED
2.5000 [IU]/h | INTRAVENOUS | Status: DC
  Filled 2019-11-11: qty 1000

## 2019-11-11 MED ORDER — ACETAMINOPHEN 325 MG PO TABS
650.0000 mg | ORAL_TABLET | ORAL | Status: DC | PRN
  Administered 2019-11-12 (×3): 650 mg via ORAL
  Filled 2019-11-11 (×3): qty 2

## 2019-11-11 MED ORDER — ACETAMINOPHEN 325 MG PO TABS: 650.0000 mg | ORAL_TABLET | ORAL | Status: DC | PRN

## 2019-11-11 MED ORDER — OXYTOCIN BOLUS FROM INFUSION: 500.0000 mL | Freq: Once | INTRAVENOUS | Status: DC

## 2019-11-11 MED ORDER — IBUPROFEN 800 MG PO TABS
800.0000 mg | ORAL_TABLET | ORAL | Status: AC
  Administered 2019-11-11: 800 mg via ORAL
  Filled 2019-11-11: qty 1

## 2019-11-11 MED ORDER — SODIUM CHLORIDE 0.9% FLUSH: 3.0000 mL | INTRAVENOUS | Status: DC | PRN

## 2019-11-11 NOTE — Progress Notes (Signed)
Felicia Simmons is a 33 y.o. [id number] at [redacted]w[redacted]d by LMP admitted for latent labor, hx rapid active phase of labor and GBS pos.  Subjective:  Working with ctx, feeling more intense since started nipple stim. No LOF or VB.  Has some dinner, denies N/V.  Objective: Vitals:   11/11/19 1538 11/11/19 1657 11/11/19 1808  BP: 104/63 (!) 104/55 (!) 100/55  Pulse: 77 79 88  Resp: 16 18 16   Temp: 98.1 F (36.7 C) 98.7 F (37.1 C)   TempSrc: Oral Oral   SpO2: 100%       FHT:  FHR: 140 bpm, variability: moderate,  accelerations:  Present,  decelerations:  Absent UC:   regular, every 2-3 minutes SVE:   Dilation: 6.5 Effacement (%): 90 Station: 0 Exam by:: D [name] BBOW Membrane sweep done Labs:   Recent Labs    11/11/19 1625  WBC 9.0  HGB 11.6*  HCT 33.0*  PLT 121*    Assessment / Plan: Augmentation of labor, progressing well - using nipple stim and membrane sweep Gest Thrombocytopenia - plts stable 121 today  Labor: plan AROM after 2nd PCN dose Preeclampsia:  no signs or symptoms of toxicity Fetal Wellbeing:  Category I Pain Control:  Labor support without medications I/D:  GBS positive, prophylaxis PCN per protocol, first dose given 1713 Anticipated MOD:  NSVD  Juliene Pina, CNM, MSN 11/11/2019, 7:14 PM

## 2019-11-11 NOTE — MAU Note (Signed)
Patient states she started having some strong contractions last night that were irregular and that occurred this morning as well. Denies LOF/VB. Reports good fetal movement. States she was 4cm dilated at her last appointment.

## 2019-11-11 NOTE — H&P (Signed)
OB ADMISSION/ HISTORY & PHYSICAL:  Admission Date: 11/11/2019  2:55 PM  Admit Diagnosis: irregular CTX    Felicia Simmons is a 33 y.o. female presenting for early labor, reports ctx rare but painful since last night and notes some bloody show with wiping. Good FM, no LOF or preE sx. Desires low intervention birth, last baby was waterbirth at [facility] [employer]  Hx rapid labor once active, now GBS positive and desires prophylaxis adequate to ensure short PP stay in hospital.   Prenatal History: [id number]   EDC : 11/11/2019, by Other Basis  Prenatal care at [facility] since 17 wks, TOC from [facility]. Good prenatal care throughout pregnancy.   Prenatal course complicated by: -Gestational thrombocytopenia, plts stable in 3rd trim, last check 12/23 - 132K - Hx rapid labors - GBS positive - urinary retention in 1st trim d/t retroverted uterus, resolved with bladder caths and postural repositioning - anemia on oral fe  Prenatal Labs: ABO, Rh:   O pos Antibody:  Neg Rubella:   Immune RPR:   Neg HBsAg:   Neg HIV:   Neg GBS:   Positive 1 hr Glucola : 83 Genetic Screening: declined Ultrasound: normal XY anatomy, posterior placenta    Maternal Diabetes: No Genetic Screening: Declined Maternal Ultrasounds/Referrals: Normal Fetal Ultrasounds or other Referrals:  None Maternal Substance Abuse:  No Significant Maternal Medications:  None Significant Maternal Lab Results:  Group B Strep positive Other Comments:  None  Medical / Surgical History :  Past medical history:  Past Medical History:  Diagnosis Date  . Medical history non-contributory      Past surgical history:  Past Surgical History:  Procedure Laterality Date  . WISDOM TOOTH EXTRACTION  2007     Family History:  Family History  Problem Relation Age of Onset  . Stroke Mother   . Heart disease Paternal Grandfather   . Hypertension Paternal Grandfather   . Heart disease Maternal Grandfather   .  Hypertension Maternal Grandfather      Social History:  reports that she has never smoked. She has never used smokeless tobacco. She reports that she does not drink alcohol or use drugs.   Allergies: Patient has no known allergies.   Current Medications at time of admission:  Medications Prior to Admission  Medication Sig Dispense Refill Last Dose  . norethindrone (ORTHO MICRONOR) 0.35 MG tablet Ortho Micronor  1 po qd     . valACYclovir (VALTREX) 1000 MG tablet TK 1 T PO Q 8 H        Review of Systems: ROS  Physical Exam: Vital signs and nursing notes reviewed.  ED Triage Vitals  Enc Vitals Group     BP 11/11/19 1538 104/63     Pulse Rate 11/11/19 1538 77     Resp 11/11/19 1538 16     Temp 11/11/19 1538 98.1 F (36.7 C)     Temp Source 11/11/19 1538 Oral     SpO2 11/11/19 1538 100 %     Weight --      Height --      Head Circumference --      Peak Flow --      Pain Score 11/11/19 1544 6     Pain Loc --      Pain Edu? --      Excl. in [place]? --      General: AAO x 3, NAD, coping well Heart: RRR Lungs:CTAB Abdomen: Gravid, NT, Leopold's vertex, fetal spine on maternal  L Extremities: no edema Genitalia / VE: Dilation: 5.5 Effacement (%): 90 Cervical Position: Middle Station: -1 Presentation: Vertex  IBOW FHR: 130 BPM, mod variability, + accels, no decels TOCO: Ctx irregular, mild/mod to palp  Labs:   Pending T&S, CBC, RPR  COVID pending    Assessment:  33 y.o. G5P4004 at [redacted]w[redacted]d  Latent labor w/ advanced dilation and hx rapid active labor FHT category I  - admit to labor, routine orders  - breast pump for nipple stim to augment labor   - AROM after 4 hrs GBS prophylaxis if not in active labor GBS positive  - PCN prophylaxis per protocol Postpartum  - early DC at 24 hrs if stable diad  - expecting boy, desires circumcision  - breastfeeding   Dr Amado Nash notified of admission / plan of care   Felicia Simmons CNM, MSN 11/11/2019, 4:20 PM

## 2019-11-12 LAB — CBC
HCT: 29.6 % — ABNORMAL LOW (ref 36.0–46.0)
Hemoglobin: 10.5 g/dL — ABNORMAL LOW (ref 12.0–15.0)
MCH: 33.3 pg (ref 26.0–34.0)
MCHC: 35.5 g/dL (ref 30.0–36.0)
MCV: 94 fL (ref 80.0–100.0)
Platelets: 136 10*3/uL — ABNORMAL LOW (ref 150–400)
RBC: 3.15 MIL/uL — ABNORMAL LOW (ref 3.87–5.11)
RDW: 12.8 % (ref 11.5–15.5)
WBC: 13.4 10*3/uL — ABNORMAL HIGH (ref 4.0–10.5)
nRBC: 0 % (ref 0.0–0.2)

## 2019-11-12 LAB — RPR: RPR Ser Ql: NONREACTIVE

## 2019-11-12 MED ORDER — PRENATAL MULTIVITAMIN CH
1.0000 | ORAL_TABLET | Freq: Every day | ORAL | Status: DC
  Administered 2019-11-12: 1 via ORAL
  Filled 2019-11-12: qty 1

## 2019-11-12 MED ORDER — WITCH HAZEL-GLYCERIN EX PADS: 1.0000 "application " | MEDICATED_PAD | CUTANEOUS | Status: DC | PRN

## 2019-11-12 MED ORDER — COCONUT OIL OIL: 1.0000 "application " | TOPICAL_OIL | 0 refills | Status: AC | PRN

## 2019-11-12 MED ORDER — FLEET ENEMA 7-19 GM/118ML RE ENEM: 1.0000 | ENEMA | Freq: Every day | RECTAL | Status: DC | PRN

## 2019-11-12 MED ORDER — ZOLPIDEM TARTRATE 5 MG PO TABS: 5.0000 mg | ORAL_TABLET | Freq: Every evening | ORAL | Status: DC | PRN

## 2019-11-12 MED ORDER — TETANUS-DIPHTH-ACELL PERTUSSIS 5-2.5-18.5 LF-MCG/0.5 IM SUSP: 0.5000 mL | Freq: Once | INTRAMUSCULAR | Status: DC

## 2019-11-12 MED ORDER — BENZOCAINE-MENTHOL 20-0.5 % EX AERO: 1.0000 "application " | INHALATION_SPRAY | CUTANEOUS | Status: AC | PRN

## 2019-11-12 MED ORDER — BENZOCAINE-MENTHOL 20-0.5 % EX AERO: 1.0000 "application " | INHALATION_SPRAY | CUTANEOUS | Status: DC | PRN

## 2019-11-12 MED ORDER — COCONUT OIL OIL: 1.0000 "application " | TOPICAL_OIL | Status: DC | PRN

## 2019-11-12 MED ORDER — SENNOSIDES-DOCUSATE SODIUM 8.6-50 MG PO TABS
2.0000 | ORAL_TABLET | ORAL | Status: DC
  Administered 2019-11-12: 2 via ORAL
  Filled 2019-11-12: qty 2

## 2019-11-12 MED ORDER — IBUPROFEN 600 MG PO TABS: 600.0000 mg | ORAL_TABLET | Freq: Four times a day (QID) | ORAL | 0 refills | Status: AC

## 2019-11-12 MED ORDER — DIPHENHYDRAMINE HCL 25 MG PO CAPS: 25.0000 mg | ORAL_CAPSULE | Freq: Four times a day (QID) | ORAL | Status: DC | PRN

## 2019-11-12 MED ORDER — ACETAMINOPHEN 500 MG PO TABS: 1000.0000 mg | ORAL_TABLET | Freq: Four times a day (QID) | ORAL | 2 refills | Status: AC | PRN

## 2019-11-12 MED ORDER — DIBUCAINE (PERIANAL) 1 % EX OINT: 1.0000 "application " | TOPICAL_OINTMENT | CUTANEOUS | Status: DC | PRN

## 2019-11-12 MED ORDER — IBUPROFEN 600 MG PO TABS
600.0000 mg | ORAL_TABLET | Freq: Four times a day (QID) | ORAL | Status: DC
  Administered 2019-11-12 (×2): 600 mg via ORAL
  Filled 2019-11-12 (×2): qty 1

## 2019-11-12 MED ORDER — SIMETHICONE 80 MG PO CHEW: 80.0000 mg | CHEWABLE_TABLET | ORAL | Status: DC | PRN

## 2019-11-12 MED ORDER — BISACODYL 10 MG RE SUPP: 10.0000 mg | Freq: Every day | RECTAL | Status: DC | PRN

## 2019-11-12 MED ORDER — PRENATAL MULTIVITAMIN CH: 1.0000 | ORAL_TABLET | Freq: Every day | ORAL | Status: AC

## 2019-11-12 MED ORDER — OXYCODONE HCL 5 MG PO TABS: 5.0000 mg | ORAL_TABLET | ORAL | 0 refills | Status: AC | PRN

## 2019-11-12 MED ORDER — ONDANSETRON HCL 4 MG PO TABS: 4.0000 mg | ORAL_TABLET | ORAL | Status: DC | PRN

## 2019-11-12 MED ORDER — ONDANSETRON HCL 4 MG/2ML IJ SOLN: 4.0000 mg | INTRAMUSCULAR | Status: DC | PRN

## 2019-11-12 NOTE — Discharge Summary (Signed)
OB Discharge Summary  Patient Name: Felicia Simmons DOB: 1986/03/22 MRN: [id number]  Date of admission: 11/11/2019 Delivering provider: Neta Mends   Date of discharge: 11/12/2019  Admitting diagnosis: Normal labor [O80, Z37.9] Intrauterine pregnancy: [redacted]w[redacted]d     Secondary diagnosis:Principal Problem:   Postpartum care following vaginal delivery 12/27 Active Problems:   NSVD (normal spontaneous vaginal delivery)   Normal labor   Benign gestational thrombocytopenia [facility])  Additional problems:none     Discharge diagnosis:  Patient Active Problem List   Diagnosis Date Noted  . Normal labor 11/11/2019  . Postpartum care following vaginal delivery 12/27 11/11/2019  . Benign gestational thrombocytopenia (HCC) 11/11/2019  . NSVD (normal spontaneous vaginal delivery) 08/18/2013                                                                Post partum procedures:none  Augmentation: membrane sweep, nipple stim Pain control: None  Laceration:None  Episiotomy:None  Complications: None   Hospital course:  Onset of Labor With Vaginal Delivery     33 y.o. yo G5P5005 at [redacted]w[redacted]d was admitted in Latent Labor on 11/11/2019. Patient had an uncomplicated labor course as follows:  Membrane Rupture Time/Date: 9:09 PM ,11/11/2019   Intrapartum Procedures: Episiotomy: None [1]                                         Lacerations:  None [1]  Patient had a delivery of a Viable infant. 11/11/2019  Information for the patient's newborn:  Ashanta, Amoroso Meriam [[id number]]       Pateint had an uncomplicated postpartum course.  She is ambulating, tolerating a regular diet, passing flatus, and urinating well. Patient is discharged home in stable condition on 11/12/19.   Physical exam  Vitals:   11/12/19 0038 11/12/19 0427 11/12/19 0820 11/12/19 1215  BP: 113/74 111/68 103/65 95/75  Pulse: 70 79 75 67  Resp: 14 14 16 18   Temp: 98.1 F (36.7 C) 98.7 F (37.1 C) 98.7 F (37.1 C) 97.6 F (36.4 C)   TempSrc: Oral Oral Oral Oral  SpO2:   97% 99%   General: alert, cooperative and no distress Lochia: appropriate Uterine Fundus: firm Incision: N/A DVT Evaluation: No cords or calf tenderness. No significant calf/ankle edema. Labs: Lab Results  Component Value Date   WBC 13.4 (H) 11/12/2019   HGB 10.5 (L) 11/12/2019   HCT 29.6 (L) 11/12/2019   MCV 94.0 11/12/2019   PLT 136 (L) 11/12/2019   CMP Latest Ref Rng & Units 05/16/2018  Glucose 70 - 99 mg/dL 93  BUN 6 - 23 mg/dL 12  Creatinine 07/17/2018 - 5.10 mg/dL 2.58  Sodium 5.27 - 782 mEq/L 141  Potassium 3.5 - 5.1 mEq/L 3.5  Chloride 96 - 112 mEq/L 108  CO2 19 - 32 mEq/L 26  Calcium 8.4 - 10.5 mg/dL 8.9  Total Protein 6.0 - 8.3 g/dL 6.6  Total Bilirubin 0.2 - 1.2 mg/dL 0.5  Alkaline Phos 39 - 117 U/L 62  AST 0 - 37 U/L 23  ALT 0 - 35 U/L 26    Vaccines: TDaP declined         Flu    declined  Discharge instruction: per After Visit Summary and "Baby and Me Booklet".  After Visit Meds:  Allergies as of 11/12/2019   No Known Allergies     Medication List    STOP taking these medications   Ortho Micronor 0.35 MG tablet Generic drug: norethindrone   valACYclovir 1000 MG tablet Commonly known as: VALTREX     TAKE these medications   acetaminophen 500 MG tablet Commonly known as: TYLENOL Take 2 tablets (1,000 mg total) by mouth every 6 (six) hours as needed.   benzocaine-Menthol 20-0.5 % Aero Commonly known as: DERMOPLAST Apply 1 application topically as needed for irritation (perineal discomfort).   coconut oil Oil Apply 1 application topically as needed.   ibuprofen 600 MG tablet Commonly known as: ADVIL Take 1 tablet (600 mg total) by mouth every 6 (six) hours.   oxyCODONE 5 MG immediate release tablet Commonly known as: Oxy IR/ROXICODONE Take 1 tablet (5 mg total) by mouth every 4 (four) hours as needed for moderate pain.   prenatal multivitamin Tabs tablet Take 1 tablet by mouth daily at 12 noon.             Discharge Care Instructions  (From admission, onward)         Start     Ordered   11/12/19 0000  Discharge wound care:    Comments: Sitz baths 2 times /day with warm water x 1 week. May add herbals: 1 ounce dried comfrey leaf* 1 ounce calendula flowers 1 ounce lavender flowers 1/2 ounce dried uva ursi leaves 1/2 ounce witch hazel blossoms (if you can find them) 1/2 ounce dried sage leaf 1/2 cup sea salt Directions: Bring 2 quarts of water to a boil. Turn off heat, and place 1 ounce (approximately 1 large handful) of the above mixed herbs (not the salt) into the pot. Steep, covered, for 30 minutes.  Strain the liquid well with a fine mesh strainer, and discard the herb material. Add 2 quarts of liquid to the tub, along with the 1/2 cup of salt. This medicinal liquid can also be made into compresses and peri-rinses.   11/12/19 1328          Diet: routine diet  Activity: Advance as tolerated. Pelvic rest for 6 weeks.   Postpartum contraception: Not Discussed  Newborn Data: Live born female  Birth Weight: 8 lb 14 oz (4026 g) APGAR: 8, 9  Newborn Delivery   Birth date/time: 11/11/2019 21:22:00 Delivery type: Vaginal, Spontaneous      named Vickki Hearing Baby Feeding: Breast Disposition:home with mother Circ completed inpatient  Delivery Report:   Review the Delivery Report for details.    Follow up: Follow-up Information    Juliene Pina, CNM. Schedule an appointment as soon as possible for a visit in 6 week(s).   Specialty: Obstetrics and Gynecology Contact information: [place] [id number] [phone]             Signed: Juliene Pina, CNM, MSN 11/12/2019, 1:28 PM

## 2019-11-12 NOTE — Lactation Note (Signed)
This note was copied from a baby's chart. Lactation Consultation Note  Patient Name: Felicia Simmons Date: 11/12/2019 Reason for consult: Initial assessment;Term P5, 4 hour female infant. Infant had one void since birth. Per mom, she has DEBP at home. Per mom, breastfeeding is going well infant breastfeed 3 times 30 minutes each since birth. Per mom, she had breastfeed infant 20 minutes prior to [facility] entering the room, LC did not observe a latch at this time. Mom is experienced at breastfeeding she breastfeed her other 4 children for 15 to 18 months each, her 4th child is now 30 years old. Per mom, she doesn't feel she needs LC services at this time, she will alert RN to inform [facility] services if help is need. Reviewed Baby & Me book's Breastfeeding Basics.  Mom made aware of O/P services, breastfeeding support groups, community resources, and our phone # for post-discharge questions.    Maternal Data Formula Feeding for Exclusion: No Has patient been taught Hand Expression?: Yes Does the patient have breastfeeding experience prior to this delivery?: Yes  Feeding    LATCH Score Latch: Grasps breast easily, tongue down, lips flanged, rhythmical sucking.  Audible Swallowing: None  Type of Nipple: Everted at rest and after stimulation  Comfort (Breast/Nipple): Soft / non-tender  Hold (Positioning): No assistance needed to correctly position infant at breast.  LATCH Score: 8  Interventions Interventions: Breast feeding basics reviewed;Skin to skin;Hand express;DEBP  Lactation Tools Discussed/Used WIC Program: No   Consult Status Consult Status: PRN Date: 11/12/19 Follow-up type: In-patient    Vicente Serene 11/12/2019, 1:22 AM

## 2023-01-29 ENCOUNTER — Inpatient Hospital Stay (HOSPITAL_COMMUNITY): Admission: RE | Admit: 2023-01-29 | Payer: 59 | Source: Home / Self Care | Admitting: Obstetrics and Gynecology
